# Patient Record
Sex: Female | Born: 1970 | Race: Black or African American | Hispanic: No | Marital: Single | Smoking: Never smoker
Health system: Southern US, Community
[De-identification: ages and names within clinical notes are randomized; demographics above are authoritative.]

## PROBLEM LIST (undated history)

## (undated) DIAGNOSIS — Z87898 Personal history of other specified conditions: Secondary | ICD-10-CM

## (undated) DIAGNOSIS — N941 Unspecified dyspareunia: Secondary | ICD-10-CM

## (undated) DIAGNOSIS — D649 Anemia, unspecified: Secondary | ICD-10-CM

## (undated) DIAGNOSIS — E559 Vitamin D deficiency, unspecified: Secondary | ICD-10-CM

## (undated) DIAGNOSIS — E041 Nontoxic single thyroid nodule: Secondary | ICD-10-CM

## (undated) DIAGNOSIS — N83201 Unspecified ovarian cyst, right side: Secondary | ICD-10-CM

## (undated) DIAGNOSIS — Z8742 Personal history of other diseases of the female genital tract: Secondary | ICD-10-CM

## (undated) DIAGNOSIS — N736 Female pelvic peritoneal adhesions (postinfective): Secondary | ICD-10-CM

---

## 2004-11-19 ENCOUNTER — Emergency Department (HOSPITAL_COMMUNITY): Admission: EM | Admit: 2004-11-19 | Discharge: 2004-11-20 | Payer: Self-pay | Admitting: Emergency Medicine

## 2006-08-02 ENCOUNTER — Emergency Department (HOSPITAL_COMMUNITY): Admission: EM | Admit: 2006-08-02 | Discharge: 2006-08-03 | Payer: Self-pay | Admitting: Emergency Medicine

## 2007-02-04 ENCOUNTER — Emergency Department (HOSPITAL_COMMUNITY): Admission: EM | Admit: 2007-02-04 | Discharge: 2007-02-04 | Payer: Self-pay | Admitting: Emergency Medicine

## 2008-01-18 DIAGNOSIS — Z87898 Personal history of other specified conditions: Secondary | ICD-10-CM

## 2008-01-18 HISTORY — DX: Personal history of other specified conditions: Z87.898

## 2008-01-20 ENCOUNTER — Ambulatory Visit (HOSPITAL_COMMUNITY): Admission: RE | Admit: 2008-01-20 | Discharge: 2008-01-20 | Payer: Self-pay | Admitting: Family Medicine

## 2008-01-22 HISTORY — PX: DILATION AND CURETTAGE OF UTERUS: SHX78

## 2008-03-31 ENCOUNTER — Emergency Department (HOSPITAL_COMMUNITY): Admission: EM | Admit: 2008-03-31 | Discharge: 2008-03-31 | Payer: Self-pay | Admitting: Emergency Medicine

## 2008-04-01 ENCOUNTER — Ambulatory Visit (HOSPITAL_COMMUNITY): Admission: RE | Admit: 2008-04-01 | Discharge: 2008-04-01 | Payer: Self-pay | Admitting: Obstetrics and Gynecology

## 2008-04-01 ENCOUNTER — Encounter (INDEPENDENT_AMBULATORY_CARE_PROVIDER_SITE_OTHER): Payer: Self-pay | Admitting: Obstetrics and Gynecology

## 2009-01-09 IMAGING — US US PELVIS COMPLETE MODIFY
1 series · 13 of 25 positions shown · non-contrast
Comparison: None

CLINICAL DATA: 37-year-old with dysfunctional uterine bleeding.
History of cesarean section.

TRANSABDOMINAL AND TRANSVAGINAL ULTRASOUND OF PELVIS
TECHNIQUE: Both transabdominal and transvaginal ultrasound
examinations of the pelvis were performed including evaluation of
the uterus, ovaries, adnexal regions, and pelvic cul-de-sac.

[Series 1: us transvaginal non-ob · 13 of 102 slices shown]
[im 1/102]
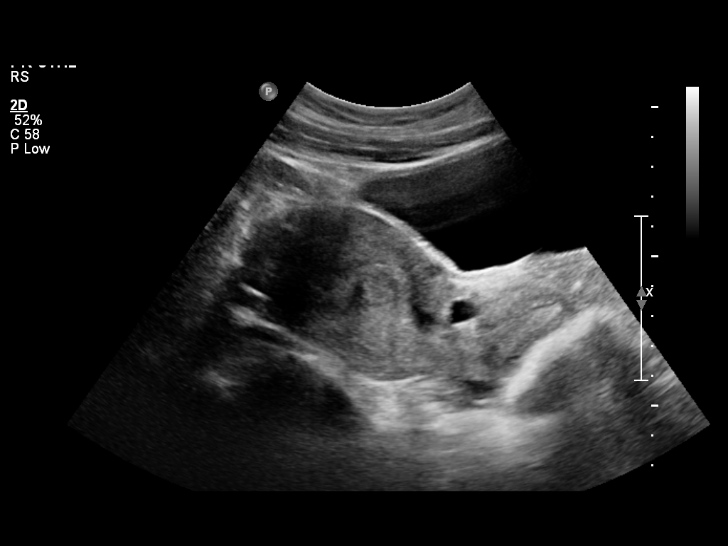
[im 9/102]
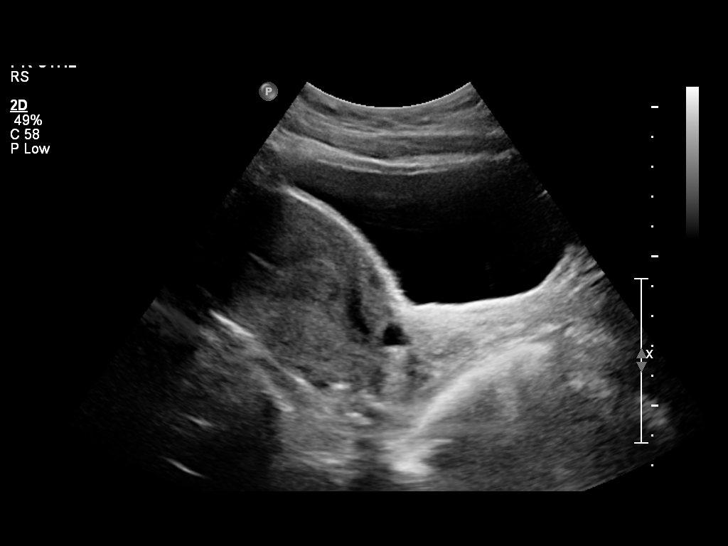
[im 17/102]
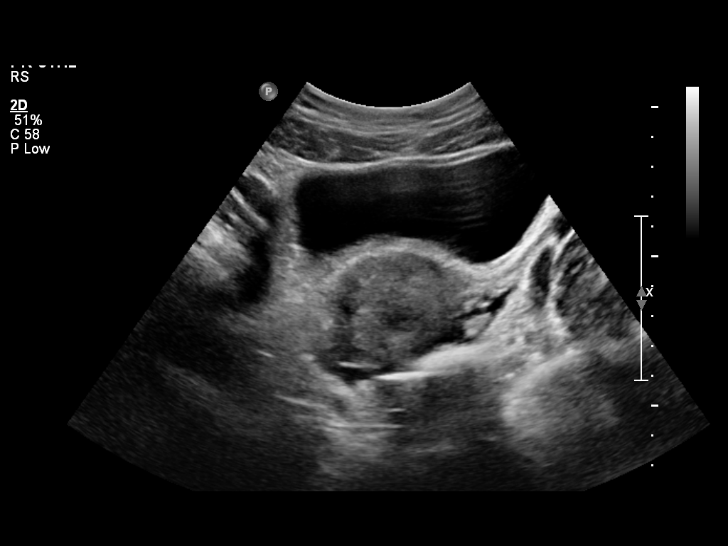
[im 26/102]
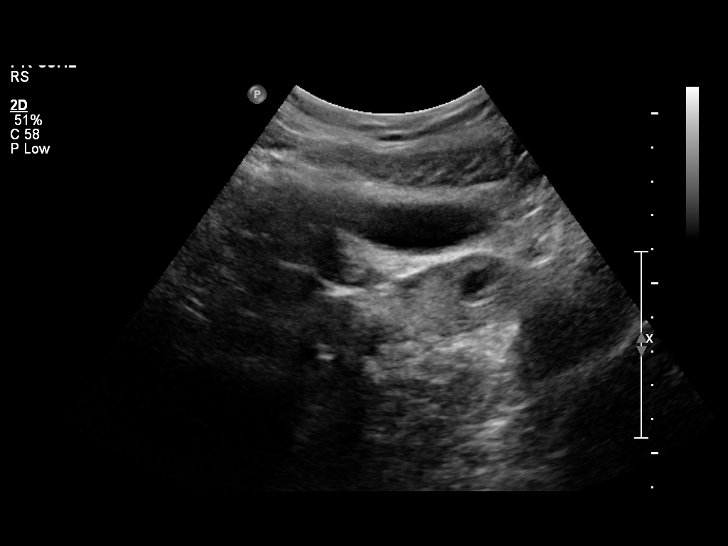
[im 34/102]
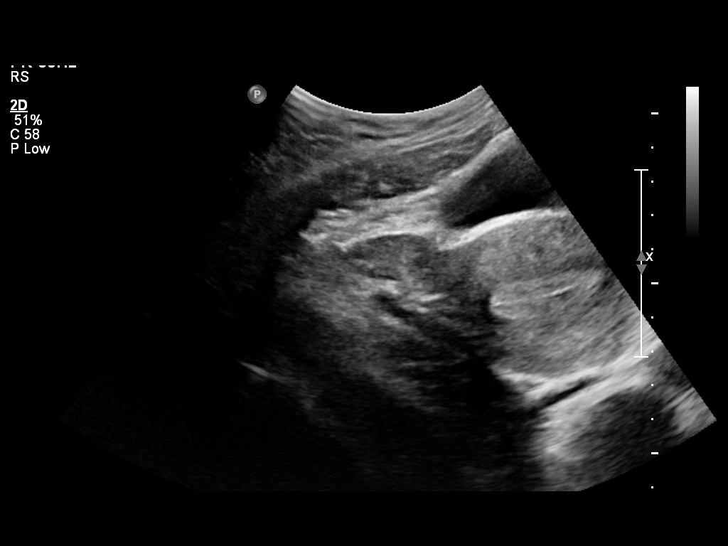
[im 43/102]
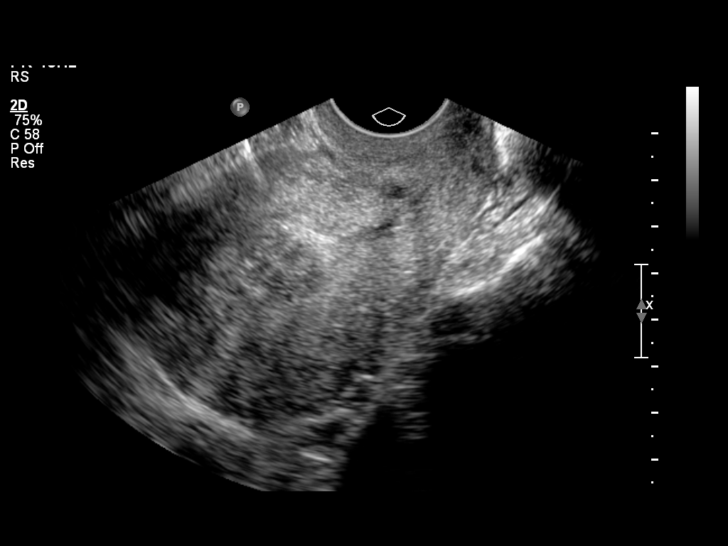
[im 51/102]
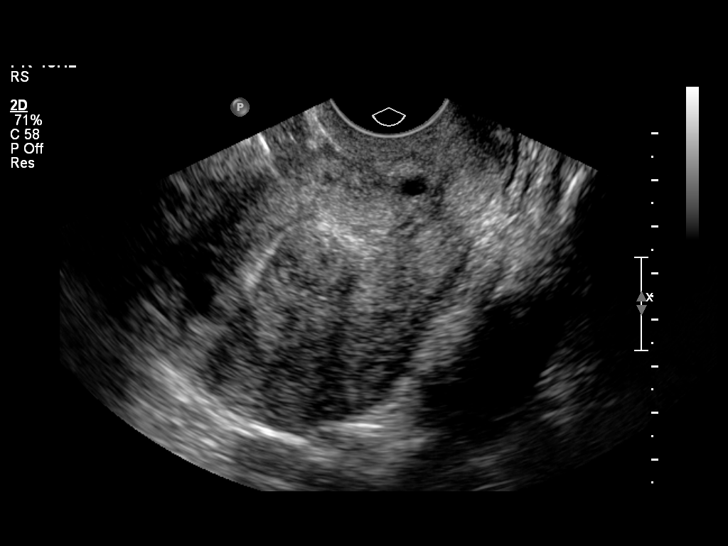
[im 59/102]
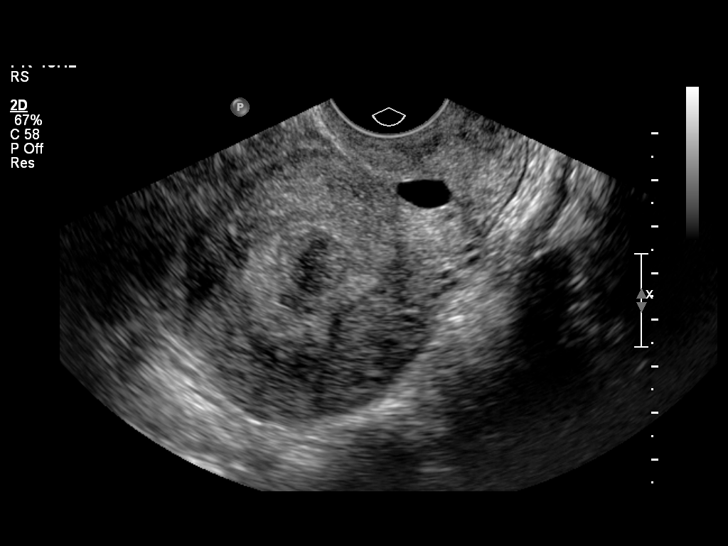
[im 68/102]
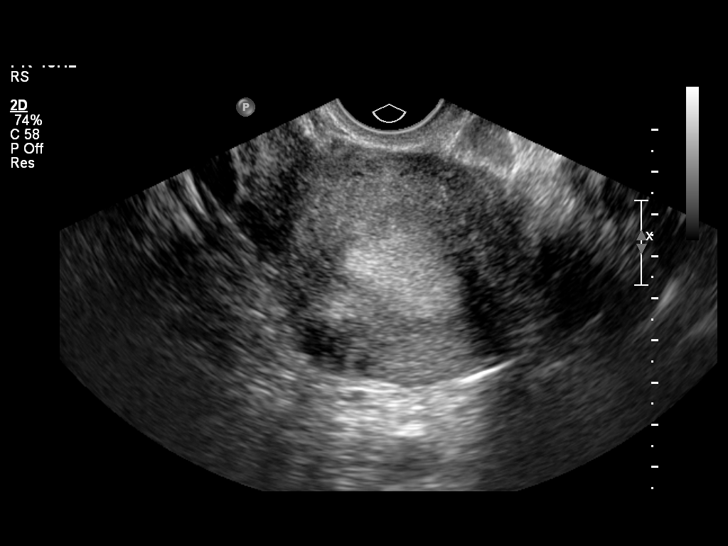
[im 76/102]
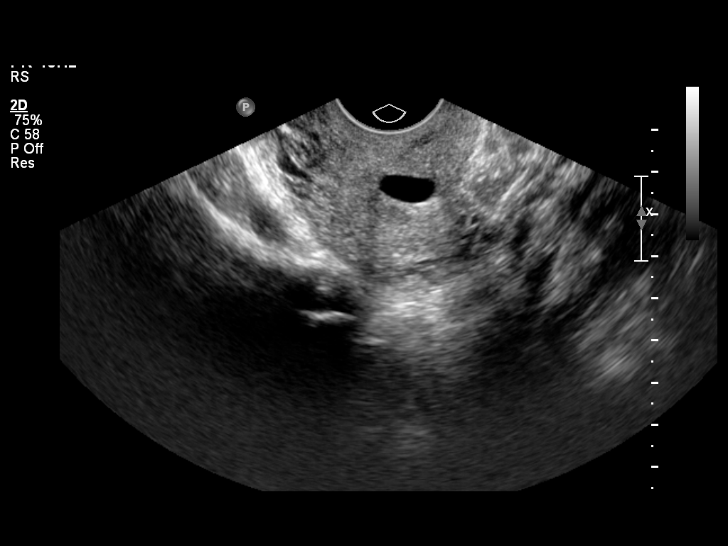
[im 85/102]
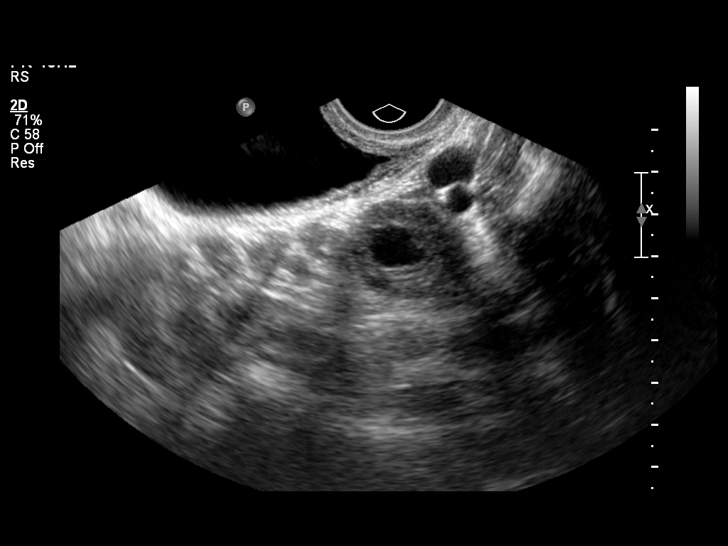
[im 93/102]
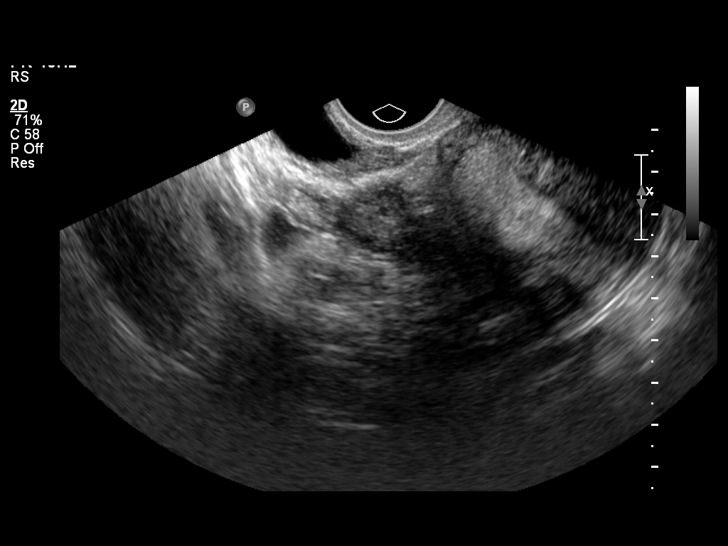
[im 102/102]
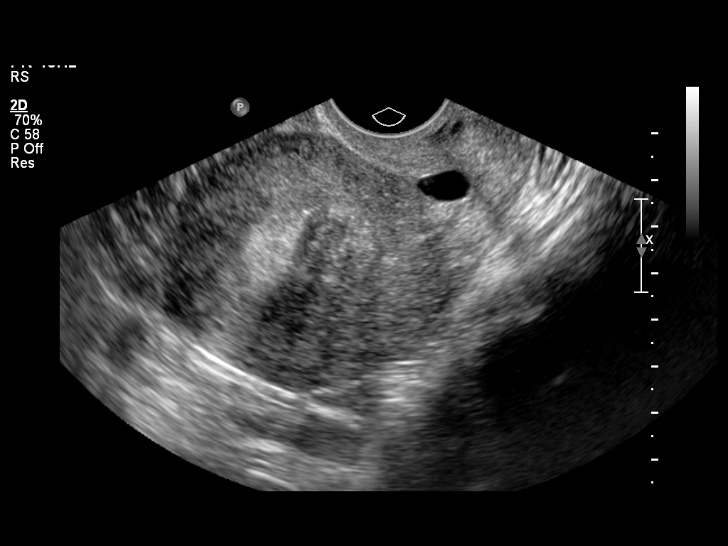

[13 of 25 positions shown; findings below may reference images not displayed]

FINDINGS: The uterus measures 8.5 x 5.4 x 6.3 cm.  At the uterine
fundus, there is a small subserosal fibroid which measures 1.9 x
1.3 x 1.9 cm.  The myometrium is otherwise homogeneous.

The endometrium is mildly thickened 1.5 cm.  Projecting into the
endometrial canal is a hypoechoic mass which measures 1.8 x 1.5 x
1.7 cm.  This demonstrates internal blood flow and may reflect an
endometrial polyp or a pedunculated submucosal fibroid.

Both ovaries are visualized and appear normal.  The right ovary
measures 4.2 x 1.9 x 2.1 cm and the left ovary measures 4.1 x 2.4 x
2.7 cm.  It contains a small involuting follicle.  There is no
significant free pelvic fluid.
IMPRESSION: 1.  1.8 cm hypoechoic mass protruding into the endometrial canal
may reflect a pedunculated fibroid or an endometrial polyp.  This
may contribute to dysfunctional uterine bleeding and may be further
evaluated with pelvic MRI or hysterosonogram.
2.  Small subserosal fundal fibroid.
3.  Both ovaries appear unremarkable.

## 2010-05-03 LAB — CBC
HCT: 35.5 % — ABNORMAL LOW (ref 36.0–46.0)
Platelets: 246 10*3/uL (ref 150–400)
RBC: 4.16 MIL/uL (ref 3.87–5.11)
WBC: 4.8 10*3/uL (ref 4.0–10.5)

## 2010-05-03 LAB — PREGNANCY, URINE

## 2010-06-05 NOTE — Op Note (Signed)
NAMEALAZE, GARVERICK                 ACCOUNT NO.:  [id number]   MEDICAL RECORD NO.:  [id number]         PATIENT TYPE:  WAMB   LOCATION:                                FACILITY:  [facility]   PHYSICIAN:  Michelle L. Grewal, M.D.DATE OF BIRTH:  1970-09-08   DATE OF PROCEDURE:  04/01/2008  DATE OF DISCHARGE:                               OPERATIVE REPORT   PREOPERATIVE DIAGNOSES:  Menorrhagia and endometrial polyp.   POSTOPERATIVE DIAGNOSES:  Menorrhagia and submucosal/pedunculated  fibroid.   PROCEDURE:  Dilation and curettage, operative hysteroscopy and resection  of submucosal fibroids.   SURGEON:  Michelle L. Grewal, MD   ANESTHESIA:  MAC with local.   FINDINGS:  A 2 cm fibroid in the endometrial cavity, questionable  pedunculated 100% was removed.   SPECIMENS:  Uterine curettings and fibroid sent to pathology.   ESTIMATED BLOOD LOSS:  Minimal.   COMPLICATIONS:  None.   PROCEDURE:  The patient was taken to the operating room.  She was given  her anesthesia.  She was prepped and draped in usual sterile fashion.  A  speculum was inserted into the vagina.  The bladder was emptied with an  in-and-out catheter.  The cervical internal os was gently dilated after  a paracervical block was performed in standard fashion.  The diagnostic  hysteroscope was then inserted to the uterine cavity and with excellent  visualization, I noted a 2 cm lesion within the cavity appeared to be  pedunculated and to me it looked more like a fibroid than a polyp.  The  ultrasound preop done at [facility] had read it as endometrial  polyp.  I removed the diagnostic hysteroscope then inserted a sharp  curette and thoroughly curetted the uterine cavity.  I inserted a polyp  forceps into the uterine cavity and then I could feel what felt like the  fibroid, thus unable to remove it.  I then converted to an operative  hysteroscope, inserted the hysteroscope, and then using the loop then  used a small amount  of cautery and I was able to slice the fibroid into  multiple pieces.  This then enabled me to remove it completely much more  easily with a polyp forceps.  I did this and then I looked again with  the hysteroscope and the uterine cavity was completely clean, and there  was no bleeding.  All instruments were removed from the vagina.  All  sponge, lap, and instrument counts were correct x2.  The patient went to  recovery room in stable condition.      Michelle L. Vincente Poli, M.D.  Electronically Signed    MLG/MEDQ  D:  04/01/2008  T:  04/01/2008  Job:  [id number]

## 2010-08-27 ENCOUNTER — Other Ambulatory Visit: Payer: Self-pay | Admitting: Family Medicine

## 2010-08-27 DIAGNOSIS — Z1231 Encounter for screening mammogram for malignant neoplasm of breast: Secondary | ICD-10-CM

## 2010-09-03 ENCOUNTER — Ambulatory Visit: Payer: Self-pay

## 2010-09-06 ENCOUNTER — Ambulatory Visit: Payer: Self-pay

## 2010-11-06 LAB — URINALYSIS, ROUTINE W REFLEX MICROSCOPIC
Bilirubin Urine: NEGATIVE
Nitrite: NEGATIVE
Specific Gravity, Urine: 1.026

## 2010-11-06 LAB — URINE MICROSCOPIC-ADD ON

## 2010-11-29 DIAGNOSIS — E559 Vitamin D deficiency, unspecified: Secondary | ICD-10-CM

## 2010-11-29 HISTORY — DX: Vitamin D deficiency, unspecified: E55.9

## 2012-06-07 ENCOUNTER — Emergency Department (HOSPITAL_COMMUNITY): Payer: Managed Care, Other (non HMO)

## 2012-06-07 ENCOUNTER — Encounter (HOSPITAL_COMMUNITY): Payer: Self-pay | Admitting: Adult Health

## 2012-06-07 ENCOUNTER — Emergency Department (HOSPITAL_COMMUNITY)
Admission: EM | Admit: 2012-06-07 | Discharge: 2012-06-07 | Disposition: A | Discharge status: Home / Self Care | Payer: Managed Care, Other (non HMO) | Attending: Emergency Medicine | Admitting: Emergency Medicine

## 2012-06-07 DIAGNOSIS — Z79899 Other long term (current) drug therapy: Secondary | ICD-10-CM | POA: Insufficient documentation

## 2012-06-07 DIAGNOSIS — R110 Nausea: Secondary | ICD-10-CM | POA: Insufficient documentation

## 2012-06-07 DIAGNOSIS — R079 Chest pain, unspecified: Secondary | ICD-10-CM

## 2012-06-07 LAB — BASIC METABOLIC PANEL
CO2: 29 mEq/L (ref 19–32)
Chloride: 103 mEq/L (ref 96–112)
Glucose, Bld: 92 mg/dL (ref 70–99)
Potassium: 4 mEq/L (ref 3.5–5.1)
Sodium: 139 mEq/L (ref 135–145)

## 2012-06-07 LAB — BASIC METABOLIC PANEL WITH GFR
BUN: 15 mg/dL (ref 6–23)
Calcium: 9.7 mg/dL (ref 8.4–10.5)
Creatinine, Ser: 0.89 mg/dL (ref 0.50–1.10)
GFR calc Af Amer: >90 mL/min (ref >90)
GFR calc non Af Amer: 79 mL/min — ABNORMAL LOW (ref >90)

## 2012-06-07 LAB — CBC
HCT: 34.2 % — ABNORMAL LOW (ref 36.0–46.0)
Hemoglobin: 12 g/dL (ref 12.0–15.0)
MCH: 28 pg (ref 26.0–34.0)
MCHC: 35.1 g/dL (ref 30.0–36.0)
MCV: 79.9 fL (ref 78.0–100.0)
Platelets: 254 10*3/uL (ref 150–400)
RBC: 4.28 MIL/uL (ref 3.87–5.11)
RDW: 13.2 % (ref 11.5–15.5)
WBC: 6.6 10*3/uL (ref 4.0–10.5)

## 2012-06-07 LAB — POCT I-STAT TROPONIN I: Troponin i, poc: 0 ng/mL (ref 0.00–0.08)

## 2012-06-07 MED ORDER — ALPRAZOLAM 0.5 MG PO TABS: 0.5000 mg | ORAL_TABLET | Freq: Three times a day (TID) | ORAL | Status: DC | PRN

## 2012-06-07 MED ORDER — LORAZEPAM 1 MG PO TABS
1.0000 mg | ORAL_TABLET | Freq: Once | ORAL | Status: AC
  Administered 2012-06-07: 1 mg via ORAL
  Filled 2012-06-07: qty 1

## 2012-06-07 MED ORDER — GI COCKTAIL ~~LOC~~
30.0000 mL | Freq: Once | ORAL | Status: AC
  Administered 2012-06-07: 30 mL via ORAL
  Filled 2012-06-07: qty 30

## 2012-06-07 NOTE — Discharge Instructions (Signed)
 Chest Pain (Nonspecific) It is often hard to give a specific diagnosis for the cause of chest pain. There is always a chance that your pain could be related to something serious, such as a heart attack or a blood clot in the lungs. You need to follow up with your caregiver for further evaluation. CAUSES   Heartburn.  Pneumonia or bronchitis.  Anxiety or stress.  Inflammation around your heart (pericarditis) or lung (pleuritis or pleurisy).  A blood clot in the lung.  A collapsed lung (pneumothorax). It can develop suddenly on its own (spontaneous pneumothorax) or from injury (trauma) to the chest.  Shingles infection (herpes zoster virus). The chest wall is composed of bones, muscles, and cartilage. Any of these can be the source of the pain.  The bones can be bruised by injury.  The muscles or cartilage can be strained by coughing or overwork.  The cartilage can be affected by inflammation and become sore (costochondritis). DIAGNOSIS  Lab tests or other studies, such as X-rays, electrocardiography, stress testing, or cardiac imaging, may be needed to find the cause of your pain.  TREATMENT   Treatment depends on what may be causing your chest pain. Treatment may include:  Acid blockers for heartburn.  Anti-inflammatory medicine.  Pain medicine for inflammatory conditions.  Antibiotics if an infection is present.  You may be advised to change lifestyle habits. This includes stopping smoking and avoiding alcohol, caffeine, and chocolate.  You may be advised to keep your head raised (elevated) when sleeping. This reduces the chance of acid going backward from your stomach into your esophagus.  Most of the time, nonspecific chest pain will improve within 2 to 3 days with rest and mild pain medicine. HOME CARE INSTRUCTIONS   If antibiotics were prescribed, take your antibiotics as directed. Finish them even if you start to feel better.  For the next few days, avoid physical  activities that bring on chest pain. Continue physical activities as directed.  Do not smoke.  Avoid drinking alcohol.  Only take over-the-counter or prescription medicine for pain, discomfort, or fever as directed by your caregiver.  Follow your caregiver's suggestions for further testing if your chest pain does not go away.  Keep any follow-up appointments you made. If you do not go to an appointment, you could develop lasting (chronic) problems with pain. If there is any problem keeping an appointment, you must call to reschedule. SEEK MEDICAL CARE IF:   You think you are having problems from the medicine you are taking. Read your medicine instructions carefully.  Your chest pain does not go away, even after treatment.  You develop a rash with blisters on your chest. SEEK IMMEDIATE MEDICAL CARE IF:   You have increased chest pain or pain that spreads to your arm, neck, jaw, back, or abdomen.  You develop shortness of breath, an increasing cough, or you are coughing up blood.  You have severe back or abdominal pain, feel nauseous, or vomit.  You develop severe weakness, fainting, or chills.  You have a fever. THIS IS AN EMERGENCY. Do not wait to see if the pain will go away. Get medical help at once. Call your local emergency services (911 in [place] Do not drive yourself to the hospital. MAKE SURE YOU:   Understand these instructions.  Will watch your condition.  Will get help right away if you are not doing well or get worse. Document Released: 10/17/2004 Document Revised: 04/01/2011 Document Reviewed: 08/13/2007 [facility] Patient Information 2013 [employer].    Narcotic and benzodiazepine use may cause drowsiness, slowed breathing or dependence.  Please use with caution and do not drive, operate machinery or watch young children alone while taking them.  Taking combinations of these medications or drinking alcohol will potentiate these effects.

## 2012-06-07 NOTE — ED Provider Notes (Signed)
History     CSN: [id number]  Arrival date & time 06/07/12  2054   First MD Initiated Contact with Patient 06/07/12 2121      Chief Complaint  Patient presents with  . Chest Pain    (Consider location/radiation/quality/duration/timing/severity/associated sxs/prior treatment) HPI Comments: Patient presents to the emergency department for evaluation of mid central chest squeezing sensation that is been waxing and waning since Wednesday. She reports no associated dizziness, lightheadedness, shortness of breath, diaphoresis. She denied any nausea or vomiting to me. Apparently she did tell triage nurse that she had some episodes of nausea but apparently her story has changed. She admits that she got on line and looked through web M.D. and came across the term angina and was concerned. She reports no significant past medical history, is not taking any current medications, does not smoke. When searching deeper, she reports that she has been under a lot more stress generally at work. In addition 5 days ago, she did have an argument with her first and did have a brief episode of burning and tightness in her chest during the episode. She reports the following day she rested almost all day. And then she woke up the following morning after that with his first episode of severe chest squeezing sensation. Since then, patient has been able to eat and drink without any difficulty. She does think that physical activity and exertion may make the pain somewhat worse and improved with rest. She denies any family history of coronary disease, heart attacks, strokes within her parents or siblings. She does have a primary care physician, sees a nurse practitioner with the [facility] system.  No antacids or other medications were used for symptoms.    Patient is a 42 y.o. female presenting with chest pain. The history is provided by the patient.  Chest Pain Pain radiates to the back: no   Onset quality:  Gradual Duration:  4  days Progression:  Waxing and waning Chronicity:  New Context: stress   Context: not breathing, not eating, no movement, not raising an arm, not at rest and no trauma   Relieved by:  Rest Associated symptoms: nausea   Associated symptoms: no abdominal pain, no cough, no diaphoresis, no dizziness, no fever, no headache, no shortness of breath and not vomiting     History reviewed. No pertinent past medical history.  History reviewed. No pertinent past surgical history.  History reviewed. No pertinent family history.  History  Substance Use Topics  . Smoking status: Not on file  . Smokeless tobacco: Not on file  . Alcohol Use: Not on file    OB History   Grav Para Term Preterm Abortions TAB SAB Ect Mult Living                  Review of Systems  Constitutional: Negative for fever, chills and diaphoresis.  HENT: Negative for congestion and neck pain.   Respiratory: Negative for cough and shortness of breath.   Cardiovascular: Positive for chest pain.  Gastrointestinal: Positive for nausea. Negative for vomiting and abdominal pain.  Skin: Negative for rash.  Neurological: Negative for dizziness, light-headedness and headaches.  Psychiatric/Behavioral:       + increase in stressors recently  All other systems reviewed and are negative.    Allergies  Review of patient's allergies indicates no known allergies.  Home Medications   Current Outpatient Rx  Name  Route  Sig  Dispense  Refill  . acetaminophen-codeine (TYLENOL #3) 300-30 MG  per tablet   Oral   Take 1 tablet by mouth every 4 (four) hours as needed for pain.         Marland Kitchen ALPRAZolam (XANAX) 0.5 MG tablet   Oral   Take 1 tablet (0.5 mg total) by mouth 3 (three) times daily as needed for anxiety.   15 tablet   0     BP 113/51  Pulse 74  Temp(Src) 98.2 F (36.8 C) (Oral)  Resp 22  SpO2 100%  LMP 05/10/2012  Physical Exam  Nursing note and vitals reviewed. Constitutional: She is oriented to person,  place, and time. She appears well-developed and well-nourished. No distress.  HENT:  Head: Normocephalic and atraumatic.  Eyes: EOM are normal. No scleral icterus.  Neck: Normal range of motion. Neck supple.  Cardiovascular: Normal rate, regular rhythm and intact distal pulses.   No murmur heard. Pulmonary/Chest: Effort normal and breath sounds normal. No respiratory distress. She has no wheezes.  Abdominal: Soft. She exhibits no distension. There is no tenderness. There is no guarding.  Musculoskeletal: She exhibits no edema.  Neurological: She is alert and oriented to person, place, and time. She exhibits normal muscle tone. Coordination normal.  Skin: Skin is warm. She is not diaphoretic.  Psychiatric: She has a normal mood and affect.    ED Course  Procedures (including critical care time)  Labs Reviewed  CBC - Abnormal; Notable for the following:    HCT 34.2 (*)    All other components within normal limits  BASIC METABOLIC PANEL - Abnormal; Notable for the following:    GFR calc non Af Amer 79 (*)    All other components within normal limits  POCT I-STAT TROPONIN I   Dg Chest 2 View  06/07/2012   *RADIOLOGY REPORT*  Clinical Data: Chest pain.  CHEST - 2 VIEW  Comparison: None.  Findings: The cardiac silhouette, mediastinal and hilar contours are normal.  The lungs are clear.  No pleural effusion.  The bony thorax is intact.  IMPRESSION: Normal chest x-ray.   Original Report Authenticated By: Rudie Meyer, M.D.     1. Chest pain     Room air saturation is 100% I interpret to be normal. Vital signs are checked and are all within normal limits. I reviewed nursing notes, triage notes.  ECG at time 21:09 shows NSR at rate 76, normal axis, RSR` leads V1, V2, likely normal variant.  Flat T wave in lead III only.  Borderline ECG.  No priors for comparison.    MDM   Patient with no risk factors for coronary disease, somewhat atypical in that pain began in setting of increased  stressors. Despite the patient's history that pain seems to worsen with physical activity, she has had no associated lightheadedness, dizziness, shortness of breath, nausea or diaphoresis. Her EKG shows no ischemic changes. Her troponin here is normal. Patient's symptoms did not improve a GI cocktail but after by mouth Ativan, she reports about 30 minutes afterwards her chest pain was significantly improved. My clinical suspicion is that stress was involved with her current symptoms. I recommended the patient follow up closely with her primary care physician and can receive a cardiology referral through them of our system and I'm also happy to give her referral to her own the [facility] cardiology group as well. Patient is in agreement with plan as outlined. L. give her a brief prescription for some Xanax to be taken as needed. She is told the side effects of Xanax  may include drowsiness. Patient understands to return for any worsening symptoms        Gavin Pound. Norvell Caswell, MD 06/08/12 0005

## 2012-06-07 NOTE — ED Notes (Signed)
EDP at bedside  

## 2012-06-07 NOTE — ED Notes (Signed)
Presents with sternal chest pressure and "squeezing" that is better with rest ongoing since last weds, pain awakens pt from sleep. Pain is worse with activity.  Associated with nausea at times.

## 2012-06-07 NOTE — ED Notes (Signed)
Patient presents with c/o epigastric pain that has been going on for a few days.  States she has been having a lot of stress at work.  Denies any SOB, N/V

## 2012-08-05 ENCOUNTER — Ambulatory Visit: Payer: Managed Care, Other (non HMO) | Admitting: Cardiovascular Disease

## 2012-09-01 ENCOUNTER — Encounter: Payer: Self-pay | Admitting: Cardiovascular Disease

## 2012-11-26 DIAGNOSIS — D649 Anemia, unspecified: Secondary | ICD-10-CM

## 2012-11-26 HISTORY — DX: Anemia, unspecified: D64.9

## 2013-07-05 ENCOUNTER — Other Ambulatory Visit: Payer: Self-pay | Admitting: Family Medicine

## 2013-07-05 DIAGNOSIS — N6009 Solitary cyst of unspecified breast: Secondary | ICD-10-CM

## 2013-07-28 ENCOUNTER — Ambulatory Visit: Payer: Managed Care, Other (non HMO)

## 2014-08-09 ENCOUNTER — Other Ambulatory Visit: Payer: Self-pay | Admitting: Family Medicine

## 2014-08-09 DIAGNOSIS — N6009 Solitary cyst of unspecified breast: Secondary | ICD-10-CM

## 2014-08-15 ENCOUNTER — Other Ambulatory Visit: Payer: Self-pay | Admitting: Nurse Practitioner

## 2014-08-15 ENCOUNTER — Other Ambulatory Visit: Payer: Managed Care, Other (non HMO)

## 2014-08-15 DIAGNOSIS — N6009 Solitary cyst of unspecified breast: Secondary | ICD-10-CM

## 2014-08-22 ENCOUNTER — Other Ambulatory Visit: Payer: Managed Care, Other (non HMO)

## 2014-10-24 ENCOUNTER — Other Ambulatory Visit: Payer: Self-pay

## 2014-10-24 DIAGNOSIS — Z1231 Encounter for screening mammogram for malignant neoplasm of breast: Secondary | ICD-10-CM

## 2014-11-11 ENCOUNTER — Ambulatory Visit: Payer: Managed Care, Other (non HMO)

## 2014-12-07 ENCOUNTER — Ambulatory Visit: Payer: Managed Care, Other (non HMO)

## 2015-05-18 LAB — GLUCOSE, POCT (MANUAL RESULT ENTRY): POC Glucose: 132 mg/dl — AB (ref 70–99)

## 2015-11-08 ENCOUNTER — Other Ambulatory Visit: Payer: Self-pay

## 2015-11-08 DIAGNOSIS — N644 Mastodynia: Secondary | ICD-10-CM

## 2015-11-09 ENCOUNTER — Other Ambulatory Visit: Payer: Self-pay | Admitting: Nurse Practitioner

## 2015-11-09 DIAGNOSIS — N644 Mastodynia: Secondary | ICD-10-CM

## 2015-11-14 ENCOUNTER — Ambulatory Visit
Admission: RE | Admit: 2015-11-14 | Discharge: 2015-11-14 | Disposition: A | Discharge status: Home / Self Care | Payer: BC Managed Care – PPO | Source: Ambulatory Visit | Attending: Nurse Practitioner | Admitting: Nurse Practitioner

## 2015-11-14 DIAGNOSIS — N644 Mastodynia: Secondary | ICD-10-CM

## 2016-09-21 DIAGNOSIS — E041 Nontoxic single thyroid nodule: Secondary | ICD-10-CM

## 2016-09-21 HISTORY — DX: Nontoxic single thyroid nodule: E04.1

## 2016-10-08 ENCOUNTER — Other Ambulatory Visit: Payer: Self-pay | Admitting: Obstetrics and Gynecology

## 2016-10-08 DIAGNOSIS — E041 Nontoxic single thyroid nodule: Secondary | ICD-10-CM

## 2016-10-17 ENCOUNTER — Ambulatory Visit
Admission: RE | Admit: 2016-10-17 | Discharge: 2016-10-17 | Disposition: A | Discharge status: Home / Self Care | Payer: BC Managed Care – PPO | Source: Ambulatory Visit | Attending: Obstetrics and Gynecology | Admitting: Obstetrics and Gynecology

## 2016-10-17 DIAGNOSIS — E041 Nontoxic single thyroid nodule: Secondary | ICD-10-CM

## 2016-10-23 ENCOUNTER — Other Ambulatory Visit: Payer: Self-pay | Admitting: Obstetrics and Gynecology

## 2016-10-23 DIAGNOSIS — Z1231 Encounter for screening mammogram for malignant neoplasm of breast: Secondary | ICD-10-CM

## 2016-10-24 ENCOUNTER — Other Ambulatory Visit: Payer: Self-pay | Admitting: Obstetrics and Gynecology

## 2016-10-24 DIAGNOSIS — E041 Nontoxic single thyroid nodule: Secondary | ICD-10-CM

## 2016-10-30 ENCOUNTER — Ambulatory Visit
Admission: RE | Admit: 2016-10-30 | Discharge: 2016-10-30 | Disposition: A | Discharge status: Home / Self Care | Payer: BC Managed Care – PPO | Source: Ambulatory Visit | Attending: Obstetrics and Gynecology | Admitting: Obstetrics and Gynecology

## 2016-10-30 ENCOUNTER — Other Ambulatory Visit (HOSPITAL_COMMUNITY)
Admission: RE | Admit: 2016-10-30 | Discharge: 2016-10-30 | Disposition: A | Discharge status: Home / Self Care | Payer: BC Managed Care – PPO | Source: Ambulatory Visit | Attending: Radiology | Admitting: Radiology

## 2016-10-30 DIAGNOSIS — E041 Nontoxic single thyroid nodule: Secondary | ICD-10-CM | POA: Insufficient documentation

## 2016-11-15 ENCOUNTER — Ambulatory Visit
Admission: RE | Admit: 2016-11-15 | Discharge: 2016-11-15 | Disposition: A | Discharge status: Home / Self Care | Payer: BC Managed Care – PPO | Source: Ambulatory Visit | Attending: Obstetrics and Gynecology | Admitting: Obstetrics and Gynecology

## 2016-11-15 DIAGNOSIS — Z1231 Encounter for screening mammogram for malignant neoplasm of breast: Secondary | ICD-10-CM

## 2016-11-15 NOTE — H&P (Signed)
NAME:  Shelia Hamilton, KARAN NO.:  [id number]  MEDICAL RECORD NO.:  [id number]  LOCATION:                                 FACILITY:  PHYSICIAN:  Darlyn Chamber, M.D.        DATE OF BIRTH:  DATE OF ADMISSION: DATE OF DISCHARGE:                             HISTORY & PHYSICAL   DATE OF SURGERY:  December 02, 2016, it is going to be done at [facility] outpatient area on [address].  HISTORY AND PHYSICAL:  The patient is a 46 year old, gravida 1, para 50 female, who presents now for laparoscopic-assisted vaginal hysterectomy. The patient has been having trouble with increasing menstrual flow.  Her periods last 7 days.  She will change pads 3 times per day with clots and dysmenorrhea and she does have significant pain with her cycle.  She underwent saline infusion ultrasound.  She had multiple fibroids, 1 impinging on the uterine cavity that could be the cause of the bleeding. We discussed various options for management including hormonal suppression versus hydrothermal ablation versus radiological embolization.  The patient now presents for laparoscopic-assisted vaginal hysterectomy.  Per the patient's request, ovaries will be left in place.  ALLERGIES:  In terms of allergies, the patient has no known drug allergies.  MEDICATIONS:  None.  PAST MEDICAL HISTORY:  She has had a cesarean section.  Otherwise, usual childhood diseases without any significant sequelae.  FAMILY HISTORY:  Mother had a history of breast cancer, otherwise unremarkable.  SOCIAL HISTORY:  No tobacco or alcohol use.  REVIEW OF SYSTEMS:  Noncontributory.  PHYSICAL EXAMINATION:  VITAL SIGNS:  The patient is afebrile.  Stable vital signs. HEENT:  The patient is normocephalic.  Pupils equal, round, and reactive to light and accommodation.  Extraocular movements were intact.  Sclerae and conjunctivae clear.  Oropharynx clear. NECK:  Without thyromegaly. BREASTS:  No discrete  masses. LUNGS:  Clear. CARDIOVASCULAR:  Regular rate.  No murmurs or gallops.  No carotid or abdominal bruits. ABDOMEN:  Benign.  No masses, organomegaly, or tenderness.  Does have a well-healed, low-transverse incision. PELVIC:  Normal external genitalia.  Vaginal mucosa is clear.  Cervix unremarkable.  Uterus upper limits of normal size.  Adnexa unremarkable.  IMPRESSION:  Menorrhagia and dysmenorrhea secondary to adenomyosis and possible uterine fibroids.  PLAN:  Again, options have been discussed and we will proceed with laparoscopic-assisted vaginal hysterectomy.  Risks of surgery have been discussed including the risk of infection.  Risk of hemorrhage that could require transfusion with the risk of AIDS or hepatitis.  Risk of injury to adjacent organs including bladder, bowel, ureters that could require further exploratory surgery.  Risk of deep venous thrombosis and pulmonary embolus.  The patient expressed understanding of the indications, risks, and alternatives.     Darlyn Chamber, M.D.   ______________________________ Darlyn Chamber, M.D.    JSM/MEDQ  D:  11/15/2016  T:  11/15/2016  Job:  [id number]

## 2016-11-15 NOTE — H&P (Signed)
Patient name  Shelia Hamilton, Shelia Hamilton DICTATION# [id number]   [facility], MD 11/15/2016 9:04 AM

## 2016-11-21 ENCOUNTER — Other Ambulatory Visit: Payer: Self-pay | Admitting: Obstetrics and Gynecology

## 2016-11-21 DIAGNOSIS — Z803 Family history of malignant neoplasm of breast: Secondary | ICD-10-CM

## 2016-11-21 HISTORY — PX: BREAST BIOPSY: SHX20

## 2016-11-22 ENCOUNTER — Ambulatory Visit
Admission: RE | Admit: 2016-11-22 | Discharge: 2016-11-22 | Disposition: A | Discharge status: Home / Self Care | Payer: BC Managed Care – PPO | Source: Ambulatory Visit | Attending: Obstetrics and Gynecology | Admitting: Obstetrics and Gynecology

## 2016-11-22 DIAGNOSIS — Z803 Family history of malignant neoplasm of breast: Secondary | ICD-10-CM

## 2016-11-22 MED ORDER — GADOBENATE DIMEGLUMINE 529 MG/ML IV SOLN
18.0000 mL | Freq: Once | INTRAVENOUS | Status: AC | PRN
  Administered 2016-11-22: 18 mL via INTRAVENOUS

## 2016-11-25 NOTE — Patient Instructions (Addendum)
    Shelia Hamilton  11/25/2016      Your procedure is scheduled on 12-02-16  Report to [facility]  At 5:30  A.M.  Call this number if you have problems the morning of surgery:[phone]             OUR ADDRESS IS [address] , WE ARE LOCATED IN [place].    Remember:  Do not eat food or drink liquids after midnight.  Take these medicines the morning of surgery with A SIP OF WATER: Alprazolam (Xanax) as needed.  Do not wear jewelry, make-up or nail polish.  Do not wear lotions, powders, or perfumes, or deoderant.  Do not shave 48 hours prior to surgery.  Men may shave face and neck.  Do not bring valuables to the hospital.  [facility] is not responsible for any belongings or valuables.  Contacts, dentures or bridgework may not be worn into surgery.  Leave your suitcase in the car.  After surgery it may be brought to your room.  For patients admitted to the hospital, discharge time will be determined by your treatment team.   Special instructions:   Please read over the following fact sheets that you were given.

## 2016-11-26 ENCOUNTER — Other Ambulatory Visit: Payer: Self-pay | Admitting: Obstetrics and Gynecology

## 2016-11-26 ENCOUNTER — Encounter (HOSPITAL_COMMUNITY)
Admission: RE | Admit: 2016-11-26 | Discharge: 2016-11-26 | Disposition: A | Discharge status: Home / Self Care | Payer: BC Managed Care – PPO | Source: Ambulatory Visit | Attending: Obstetrics and Gynecology | Admitting: Obstetrics and Gynecology

## 2016-11-27 ENCOUNTER — Other Ambulatory Visit: Payer: Self-pay | Admitting: Obstetrics and Gynecology

## 2016-11-27 DIAGNOSIS — R9389 Abnormal findings on diagnostic imaging of other specified body structures: Secondary | ICD-10-CM

## 2016-11-28 ENCOUNTER — Other Ambulatory Visit (HOSPITAL_COMMUNITY): Payer: BC Managed Care – PPO

## 2016-11-29 ENCOUNTER — Other Ambulatory Visit: Payer: BC Managed Care – PPO

## 2016-12-03 ENCOUNTER — Other Ambulatory Visit: Payer: Self-pay | Admitting: Obstetrics and Gynecology

## 2016-12-03 ENCOUNTER — Ambulatory Visit
Admission: RE | Admit: 2016-12-03 | Discharge: 2016-12-03 | Disposition: A | Discharge status: Home / Self Care | Payer: BC Managed Care – PPO | Source: Ambulatory Visit | Attending: Obstetrics and Gynecology | Admitting: Obstetrics and Gynecology

## 2016-12-03 DIAGNOSIS — N6310 Unspecified lump in the right breast, unspecified quadrant: Secondary | ICD-10-CM

## 2016-12-03 DIAGNOSIS — R9389 Abnormal findings on diagnostic imaging of other specified body structures: Secondary | ICD-10-CM

## 2016-12-05 ENCOUNTER — Ambulatory Visit
Admission: RE | Admit: 2016-12-05 | Discharge: 2016-12-05 | Disposition: A | Discharge status: Home / Self Care | Payer: BC Managed Care – PPO | Source: Ambulatory Visit | Attending: Obstetrics and Gynecology | Admitting: Obstetrics and Gynecology

## 2016-12-05 DIAGNOSIS — N6310 Unspecified lump in the right breast, unspecified quadrant: Secondary | ICD-10-CM

## 2016-12-05 DIAGNOSIS — R9389 Abnormal findings on diagnostic imaging of other specified body structures: Secondary | ICD-10-CM

## 2016-12-09 ENCOUNTER — Ambulatory Visit: Payer: Self-pay | Admitting: Surgery

## 2017-01-07 ENCOUNTER — Encounter: Payer: Self-pay | Admitting: Surgery

## 2017-01-07 DIAGNOSIS — E041 Nontoxic single thyroid nodule: Secondary | ICD-10-CM | POA: Diagnosis present

## 2017-01-07 NOTE — H&P (Signed)
General Surgery [facility]  Ailene Ards DOB: 1970/06/21 Divorced / Language: Cleophus Molt / Race: Black or African American Female   History of Present Illness   The patient is a 46 year old female who presents with a thyroid nodule.  CC: right thyroid nodule  Patient is referred by Dr. Jacelyn Pi for surgical evaluation and management of right thyroid nodule. Patient's gynecologist is Dr. Arvella Nigh. Patient was found on physical examination by her gynecologist to have a right sided thyroid mass. She subsequently underwent thyroid ultrasound showing a dominant nodule on the right side measuring 3.4 x 1.6 x 2.2 cm. Left thyroid lobe was normal. Patient underwent biopsy on October 30, 2016. This demonstrated a benign follicular nodule, Bethesda category II. Patient complains of compressive symptoms including discomfort in the right neck radiating towards the ear, occasional shortness of breath and air hunger, intermittent hoarseness, and globus sensation. She denies any significant dysphagia. She has never been on thyroid medication. TSH level was normal at 1.33. She has had no prior head or neck surgery. There is no family history of thyroid disease and no family history of other endocrine neoplasms. Patient was evaluated by endocrinology and referred to surgery for consideration for right thyroid lobectomy for dominant thyroid nodule with compressive symptoms. 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   Past Surgical History  Cesarean Section - 1   Diagnostic Studies History  Colonoscopy  never Mammogram  within last year Pap Smear  1-5 years ago  Allergies No Known Drug Allergies 12/09/2016 Allergies Reconciled   Medication History  No Current Medications Medications Reconciled  Social History No alcohol use  No caffeine use  No drug use  Tobacco use  Never smoker.  Family History  Alcohol Abuse   Mother. Breast Cancer  Mother. Cerebrovascular Accident  Family Members In General. Depression  Mother. Heart disease in female family member before age 42  Hypertension  Family Members In General.  Pregnancy / Birth History  Age at menarche  65 years. Gravida  1 Irregular periods  Length (months) of breastfeeding  12-24 Maternal age  76-25 Para  1  Other Problems No pertinent past medical history   Review of Systems General Present- Fatigue, Night Sweats and Weight Gain. Not Present- Appetite Loss, Chills, Fever and Weight Loss. Skin Not Present- Change in Wart/Mole, Dryness, Hives, Jaundice, New Lesions, Non-Healing Wounds, Rash and Ulcer. HEENT Present- Earache, Hoarseness, Ringing in the Ears and Sore Throat. Not Present- Hearing Loss, Nose Bleed, Oral Ulcers, Seasonal Allergies, Sinus Pain, Visual Disturbances, Wears glasses/contact lenses and Yellow Eyes. Respiratory Not Present- Bloody sputum, Chronic Cough, Difficulty Breathing, Snoring and Wheezing. Cardiovascular Not Present- Chest Pain, Difficulty Breathing Lying Down, Leg Cramps, Palpitations, Rapid Heart Rate, Shortness of Breath and Swelling of Extremities. Gastrointestinal Present- Difficulty Swallowing. Not Present- Abdominal Pain, Bloating, Bloody Stool, Change in Bowel Habits, Chronic diarrhea, Constipation, Excessive gas, Gets full quickly at meals, Hemorrhoids, Indigestion, Nausea, Rectal Pain and Vomiting. Female Genitourinary Not Present- Frequency, Nocturia, Painful Urination, Pelvic Pain and Urgency. Musculoskeletal Not Present- Back Pain, Joint Pain, Joint Stiffness, Muscle Pain, Muscle Weakness and Swelling of Extremities. Neurological Present- Numbness and Tingling. Not Present- Decreased Memory, Fainting, Headaches, Seizures, Tremor, Trouble walking and Weakness. Psychiatric Not Present- Anxiety, Bipolar, Change in Sleep Pattern, Depression, Fearful and Frequent crying. Endocrine Present- Cold  Intolerance. Not Present- Excessive Hunger, Hair Changes, Heat Intolerance, Hot flashes and New Diabetes.  Vitals Weight: 195 lb Height:  63in Body Surface Area: 1.91 m Body Mass Index: 34.54 kg/m  Temp.: 98.41F  Pulse: 86 (Regular)  BP: 118/72 (Sitting, Left Arm, Standard)   Physical Exam   See vital signs recorded above  GENERAL APPEARANCE Development: normal Nutritional status: normal Gross deformities: none  SKIN Rash, lesions, ulcers: none Induration, erythema: none Nodules: none palpable  EYES Conjunctiva and lids: normal Pupils: equal and reactive Iris: normal bilaterally  EARS, NOSE, MOUTH, THROAT External ears: no lesion or deformity External nose: no lesion or deformity Hearing: grossly normal Lips: no lesion or deformity Dentition: normal for age Oral mucosa: moist  NECK Symmetric: yes Trachea: midline Thyroid: Dominant mass right thyroid lobe, smooth, firm, mobile with swallowing, mildly tender to palpation; left thyroid lobe without palpable abnormality  CHEST Respiratory effort: normal Retraction or accessory muscle use: no Breath sounds: normal bilaterally Rales, rhonchi, wheeze: none  CARDIOVASCULAR Auscultation: regular rhythm, normal rate Murmurs: none Pulses: carotid and radial pulse 2+ palpable Lower extremity edema: none Lower extremity varicosities: none  MUSCULOSKELETAL Station and gait: normal Digits and nails: no clubbing or cyanosis Muscle strength: grossly normal all extremities Range of motion: grossly normal all extremities Deformity: none  LYMPHATIC Cervical: none palpable Supraclavicular: none palpable  PSYCHIATRIC Oriented to person, place, and time: yes Mood and affect: normal for situation Judgment and insight: appropriate for situation    Assessment & Plan  RIGHT THYROID NODULE (E04.1)  Pt Education - Pamphlet Given - The Thyroid Book: discussed with patient and provided information. Patient  is referred for evaluation of right thyroid nodule with compressive symptoms. Patient is provided with written literature on thyroid surgery to review at home.  Patient has a dominant mass in the right thyroid lobe. Biopsy shows this to be benign. We discussed the fact that this did not absolutely require surgical resection and could be followed with annual ultrasound examination and physical examination.  However she does complain of compressive symptoms and desires surgical removal. Patient and I discussed the procedure of right thyroid lobectomy. We discussed risk and benefits including the potential for recurrent laryngeal nerve injury. We discussed the potential need for additional surgery in the event of malignancy. We discussed the hospital stay to be anticipated and her postoperative recovery and return to work. Patient understands and wishes to proceed with surgery after the first of the year.  The risks and benefits of the procedure have been discussed at length with the patient. The patient understands the proposed procedure, potential alternative treatments, and the course of recovery to be expected. All of the patient's questions have been answered at this time. The patient wishes to proceed with surgery.  Armandina Gemma, [place] [facility] Office: [phone]

## 2017-01-09 NOTE — Patient Instructions (Addendum)
Shelia Hamilton  01/09/2017   Your procedure is scheduled on:   01-17-17  Report to [facility] Main  Entrance Take [place]  elevators to 3rd floor to  [place] at  [place] AM.    Call this number if you have problems the morning of surgery [phone]    Remember: ONLY 1 PERSON MAY GO WITH YOU TO SHORT STAY TO GET  READY MORNING OF YOUR SURGERY.  Do not eat food or drink liquids :After Midnight.     Take these medicines the morning of surgery with A SIP OF WATER: NONE                                You may not have any metal on your body including hair pins and              piercings  Do not wear jewelry, make-up, lotions, powders or perfumes, deodorant             Do not wear nail polish.  Do not shave  48 hours prior to surgery.        Do not bring valuables to the hospital. [place].  Contacts, dentures or bridgework may not be worn into surgery.  Leave suitcase in the car. After surgery it may be brought to your room.               Please read over the following fact sheets you were given: _____________________________________________________________________           [facility] - Preparing for Surgery Before surgery, you can play an important role.  Because skin is not sterile, your skin needs to be as free of germs as possible.  You can reduce the number of germs on your skin by washing with CHG (chlorahexidine gluconate) soap before surgery.  CHG is an antiseptic cleaner which kills germs and bonds with the skin to continue killing germs even after washing. Please DO NOT use if you have an allergy to CHG or antibacterial soaps.  If your skin becomes reddened/irritated stop using the CHG and inform your nurse when you arrive at Short Stay. Do not shave (including legs and underarms) for at least 48 hours prior to the first CHG shower.  You may shave your face/neck. Please follow these instructions  carefully:  1.  Shower with CHG Soap the night before surgery and the  morning of Surgery.  2.  If you choose to wash your hair, wash your hair first as usual with your  normal  shampoo.  3.  After you shampoo, rinse your hair and body thoroughly to remove the  shampoo.                           4.  Use CHG as you would any other liquid soap.  You can apply chg directly  to the skin and wash                       Gently with a scrungie or clean washcloth.  5.  Apply the CHG Soap to your body ONLY FROM THE NECK DOWN.   Do not use on  face/ open                           Wound or open sores. Avoid contact with eyes, ears mouth and genitals (private parts).                       Wash face,  Genitals (private parts) with your normal soap.             6.  Wash thoroughly, paying special attention to the area where your surgery  will be performed.  7.  Thoroughly rinse your body with warm water from the neck down.  8.  DO NOT shower/wash with your normal soap after using and rinsing off  the CHG Soap.                9.  Pat yourself dry with a clean towel.            10.  Wear clean pajamas.            11.  Place clean sheets on your bed the night of your first shower and do not  sleep with pets. Day of Surgery : Do not apply any lotions/deodorants the morning of surgery.  Please wear clean clothes to the hospital/surgery center.  FAILURE TO FOLLOW THESE INSTRUCTIONS MAY RESULT IN THE CANCELLATION OF YOUR SURGERY PATIENT SIGNATURE_________________________________  NURSE SIGNATURE__________________________________  ________________________________________________________________________

## 2017-01-15 ENCOUNTER — Ambulatory Visit (HOSPITAL_COMMUNITY)
Admission: RE | Admit: 2017-01-15 | Discharge: 2017-01-15 | Disposition: A | Discharge status: Home / Self Care | Payer: BC Managed Care – PPO | Source: Ambulatory Visit | Attending: Anesthesiology | Admitting: Anesthesiology

## 2017-01-15 ENCOUNTER — Encounter (HOSPITAL_COMMUNITY): Payer: Self-pay | Admitting: Surgery

## 2017-01-15 ENCOUNTER — Encounter (HOSPITAL_COMMUNITY): Payer: Self-pay

## 2017-01-15 ENCOUNTER — Other Ambulatory Visit: Payer: Self-pay

## 2017-01-15 ENCOUNTER — Encounter (INDEPENDENT_AMBULATORY_CARE_PROVIDER_SITE_OTHER): Payer: Self-pay

## 2017-01-15 ENCOUNTER — Encounter (HOSPITAL_COMMUNITY)
Admission: RE | Admit: 2017-01-15 | Discharge: 2017-01-15 | Disposition: A | Discharge status: Home / Self Care | Payer: BC Managed Care – PPO | Source: Ambulatory Visit | Attending: Surgery | Admitting: Surgery

## 2017-01-15 DIAGNOSIS — E041 Nontoxic single thyroid nodule: Secondary | ICD-10-CM | POA: Insufficient documentation

## 2017-01-15 DIAGNOSIS — Z01818 Encounter for other preprocedural examination: Secondary | ICD-10-CM | POA: Insufficient documentation

## 2017-01-15 LAB — CBC
HEMATOCRIT: 30.4 % — AB (ref 36.0–46.0)
HEMOGLOBIN: 10 g/dL — AB (ref 12.0–15.0)
MCH: 25.6 pg — ABNORMAL LOW (ref 26.0–34.0)
MCHC: 32.9 g/dL (ref 30.0–36.0)
MCV: 77.9 fL — ABNORMAL LOW (ref 78.0–100.0)
Platelets: 295 10*3/uL (ref 150–400)
RBC: 3.9 MIL/uL (ref 3.87–5.11)
RDW: 15.4 % (ref 11.5–15.5)
WBC: 5.9 10*3/uL (ref 4.0–10.5)

## 2017-01-15 LAB — BASIC METABOLIC PANEL
ANION GAP: 7 (ref 5–15)
BUN: 10 mg/dL (ref 6–20)
CO2: 27 mmol/L (ref 22–32)
Calcium: 9.2 mg/dL (ref 8.9–10.3)
Chloride: 104 mmol/L (ref 101–111)
Creatinine, Ser: 0.88 mg/dL (ref 0.44–1.00)
GFR calc Af Amer: >60 mL/min (ref >60)
Glucose, Bld: 93 mg/dL (ref 65–99)
POTASSIUM: 3.6 mmol/L (ref 3.5–5.1)
SODIUM: 138 mmol/L (ref 135–145)

## 2017-01-15 LAB — HCG, SERUM, QUALITATIVE: Preg, Serum: NEGATIVE

## 2017-01-17 ENCOUNTER — Ambulatory Visit (HOSPITAL_COMMUNITY): Payer: BC Managed Care – PPO | Admitting: Anesthesiology

## 2017-01-17 ENCOUNTER — Encounter (HOSPITAL_COMMUNITY): Payer: Self-pay | Admitting: *Deleted

## 2017-01-17 ENCOUNTER — Encounter (HOSPITAL_COMMUNITY)
Admission: RE | Disposition: A | Discharge status: Home / Self Care | Payer: Self-pay | Source: Ambulatory Visit | Attending: Surgery

## 2017-01-17 ENCOUNTER — Observation Stay (HOSPITAL_COMMUNITY)
Admission: RE | Admit: 2017-01-17 | Discharge: 2017-01-18 | Disposition: A | Discharge status: Home / Self Care | Payer: BC Managed Care – PPO | Source: Ambulatory Visit | Attending: Surgery | Admitting: Surgery

## 2017-01-17 ENCOUNTER — Other Ambulatory Visit: Payer: Self-pay

## 2017-01-17 DIAGNOSIS — Z79899 Other long term (current) drug therapy: Secondary | ICD-10-CM | POA: Insufficient documentation

## 2017-01-17 DIAGNOSIS — E041 Nontoxic single thyroid nodule: Secondary | ICD-10-CM | POA: Diagnosis present

## 2017-01-17 HISTORY — PX: THYROID LOBECTOMY: SHX420

## 2017-01-17 SURGERY — LOBECTOMY, THYROID
Anesthesia: General | Site: Neck | Laterality: Right

## 2017-01-17 MED ORDER — PROPOFOL 10 MG/ML IV BOLUS
INTRAVENOUS | Status: DC | PRN
  Administered 2017-01-17: 150 mg via INTRAVENOUS

## 2017-01-17 MED ORDER — FENTANYL CITRATE (PF) 100 MCG/2ML IJ SOLN
INTRAMUSCULAR | Status: DC | PRN
  Administered 2017-01-17: 50 ug via INTRAVENOUS
  Administered 2017-01-17: 100 ug via INTRAVENOUS
  Administered 2017-01-17: 50 ug via INTRAVENOUS

## 2017-01-17 MED ORDER — ROCURONIUM BROMIDE 100 MG/10ML IV SOLN
INTRAVENOUS | Status: DC | PRN
  Administered 2017-01-17: 50 mg via INTRAVENOUS

## 2017-01-17 MED ORDER — ONDANSETRON 4 MG PO TBDP
4.0000 mg | ORAL_TABLET | Freq: Four times a day (QID) | ORAL | Status: DC | PRN
  Administered 2017-01-18: 4 mg via ORAL
  Filled 2017-01-17: qty 1

## 2017-01-17 MED ORDER — FENTANYL CITRATE (PF) 100 MCG/2ML IJ SOLN
INTRAMUSCULAR | Status: AC
  Filled 2017-01-17: qty 2

## 2017-01-17 MED ORDER — DEXAMETHASONE SODIUM PHOSPHATE 10 MG/ML IJ SOLN
INTRAMUSCULAR | Status: DC | PRN
  Administered 2017-01-17: 10 mg via INTRAVENOUS

## 2017-01-17 MED ORDER — CHLORHEXIDINE GLUCONATE CLOTH 2 % EX PADS: 6.0000 | MEDICATED_PAD | Freq: Once | CUTANEOUS | Status: DC

## 2017-01-17 MED ORDER — FENTANYL CITRATE (PF) 250 MCG/5ML IJ SOLN
INTRAMUSCULAR | Status: AC
  Filled 2017-01-17: qty 5

## 2017-01-17 MED ORDER — SUGAMMADEX SODIUM 200 MG/2ML IV SOLN
INTRAVENOUS | Status: DC | PRN
  Administered 2017-01-17: 200 mg via INTRAVENOUS

## 2017-01-17 MED ORDER — ONDANSETRON HCL 4 MG/2ML IJ SOLN: 4.0000 mg | Freq: Four times a day (QID) | INTRAMUSCULAR | Status: DC | PRN

## 2017-01-17 MED ORDER — HYDROMORPHONE HCL 1 MG/ML IJ SOLN
1.0000 mg | INTRAMUSCULAR | Status: DC | PRN
  Administered 2017-01-17 – 2017-01-18 (×3): 1 mg via INTRAVENOUS
  Filled 2017-01-17 (×3): qty 1

## 2017-01-17 MED ORDER — LIDOCAINE 2% (20 MG/ML) 5 ML SYRINGE
INTRAMUSCULAR | Status: AC
  Filled 2017-01-17: qty 5

## 2017-01-17 MED ORDER — TRAMADOL HCL 50 MG PO TABS: 50.0000 mg | ORAL_TABLET | Freq: Four times a day (QID) | ORAL | Status: DC | PRN

## 2017-01-17 MED ORDER — ACETAMINOPHEN 325 MG PO TABS: 650.0000 mg | ORAL_TABLET | Freq: Four times a day (QID) | ORAL | Status: DC | PRN

## 2017-01-17 MED ORDER — MIDAZOLAM HCL 5 MG/5ML IJ SOLN
INTRAMUSCULAR | Status: DC | PRN
  Administered 2017-01-17: 2 mg via INTRAVENOUS

## 2017-01-17 MED ORDER — 0.9 % SODIUM CHLORIDE (POUR BTL) OPTIME
TOPICAL | Status: DC | PRN
  Administered 2017-01-17: 1000 mL

## 2017-01-17 MED ORDER — LIDOCAINE HCL (CARDIAC) 20 MG/ML IV SOLN
INTRAVENOUS | Status: DC | PRN
  Administered 2017-01-17: 50 mg via INTRAVENOUS

## 2017-01-17 MED ORDER — ONDANSETRON HCL 4 MG/2ML IJ SOLN
INTRAMUSCULAR | Status: AC
  Filled 2017-01-17: qty 2

## 2017-01-17 MED ORDER — CEFAZOLIN SODIUM-DEXTROSE 2-4 GM/100ML-% IV SOLN
2.0000 g | INTRAVENOUS | Status: AC
  Administered 2017-01-17: 2 g via INTRAVENOUS
  Filled 2017-01-17: qty 100

## 2017-01-17 MED ORDER — ROCURONIUM BROMIDE 50 MG/5ML IV SOSY
PREFILLED_SYRINGE | INTRAVENOUS | Status: AC
  Filled 2017-01-17: qty 5

## 2017-01-17 MED ORDER — MIDAZOLAM HCL 2 MG/2ML IJ SOLN
INTRAMUSCULAR | Status: AC
  Filled 2017-01-17: qty 2

## 2017-01-17 MED ORDER — KCL IN DEXTROSE-NACL 20-5-0.45 MEQ/L-%-% IV SOLN
INTRAVENOUS | Status: DC
  Administered 2017-01-17: 16:00:00 via INTRAVENOUS
  Filled 2017-01-17 (×2): qty 1000

## 2017-01-17 MED ORDER — FENTANYL CITRATE (PF) 100 MCG/2ML IJ SOLN
25.0000 ug | INTRAMUSCULAR | Status: DC | PRN
  Administered 2017-01-17 (×2): 25 ug via INTRAVENOUS
  Administered 2017-01-17 (×3): 50 ug via INTRAVENOUS

## 2017-01-17 MED ORDER — TRAMADOL HCL 50 MG PO TABS: 50.0000 mg | ORAL_TABLET | Freq: Four times a day (QID) | ORAL | 0 refills | Status: DC | PRN

## 2017-01-17 MED ORDER — ONDANSETRON HCL 4 MG/2ML IJ SOLN
INTRAMUSCULAR | Status: DC | PRN
  Administered 2017-01-17: 4 mg via INTRAVENOUS

## 2017-01-17 MED ORDER — LACTATED RINGERS IV SOLN
INTRAVENOUS | Status: DC
  Administered 2017-01-17: 10:00:00 via INTRAVENOUS

## 2017-01-17 MED ORDER — PROPOFOL 10 MG/ML IV BOLUS
INTRAVENOUS | Status: AC
  Filled 2017-01-17: qty 20

## 2017-01-17 MED ORDER — DEXAMETHASONE SODIUM PHOSPHATE 10 MG/ML IJ SOLN
INTRAMUSCULAR | Status: AC
  Filled 2017-01-17: qty 1

## 2017-01-17 MED ORDER — SUGAMMADEX SODIUM 200 MG/2ML IV SOLN
INTRAVENOUS | Status: AC
  Filled 2017-01-17: qty 2

## 2017-01-17 MED ORDER — CEFAZOLIN SODIUM-DEXTROSE 2-4 GM/100ML-% IV SOLN: 2.0000 g | INTRAVENOUS | Status: DC

## 2017-01-17 MED ORDER — HYDROCODONE-ACETAMINOPHEN 5-325 MG PO TABS: 1.0000 | ORAL_TABLET | ORAL | Status: DC | PRN

## 2017-01-17 MED ORDER — ACETAMINOPHEN 650 MG RE SUPP: 650.0000 mg | Freq: Four times a day (QID) | RECTAL | Status: DC | PRN

## 2017-01-17 SURGICAL SUPPLY — 35 items
ATTRACTOMAT 16X20 MAGNETIC DRP (DRAPES) ×3 IMPLANT
BLADE SURG 15 STRL LF DISP TIS (BLADE) ×1 IMPLANT
BLADE SURG 15 STRL SS (BLADE) ×3
CHLORAPREP W/TINT 26ML (MISCELLANEOUS) ×6 IMPLANT
CLIP VESOCCLUDE MED 6/CT (CLIP) ×8 IMPLANT
CLIP VESOCCLUDE SM WIDE 6/CT (CLIP) ×8 IMPLANT
CLOSURE WOUND 1/2 X4 (GAUZE/BANDAGES/DRESSINGS) ×1
DISSECTOR ROUND CHERRY 3/8 STR (MISCELLANEOUS) IMPLANT
DRAPE LAPAROTOMY T 98X78 PEDS (DRAPES) ×3 IMPLANT
ELECT PENCIL ROCKER SW 15FT (MISCELLANEOUS) ×3 IMPLANT
ELECT REM PT RETURN 15FT ADLT (MISCELLANEOUS) ×3 IMPLANT
GAUZE SPONGE 4X4 12PLY STRL (GAUZE/BANDAGES/DRESSINGS) ×2 IMPLANT
GAUZE SPONGE 4X4 16PLY XRAY LF (GAUZE/BANDAGES/DRESSINGS) ×3 IMPLANT
GLOVE SURG ORTHO 8.0 STRL STRW (GLOVE) ×3 IMPLANT
GOWN STRL REUS W/TWL XL LVL3 (GOWN DISPOSABLE) ×6 IMPLANT
HEMOSTAT SURGICEL 2X4 FIBR (HEMOSTASIS) ×3 IMPLANT
ILLUMINATOR WAVEGUIDE N/F (MISCELLANEOUS) IMPLANT
KIT BASIN OR (CUSTOM PROCEDURE TRAY) ×3 IMPLANT
LIGHT WAVEGUIDE WIDE FLAT (MISCELLANEOUS) IMPLANT
PACK BASIC VI WITH GOWN DISP (CUSTOM PROCEDURE TRAY) ×3 IMPLANT
POWDER SURGICEL 3.0 GRAM (HEMOSTASIS) ×2 IMPLANT
SHEARS HARMONIC 9CM CVD (BLADE) ×3 IMPLANT
STAPLER VISISTAT 35W (STAPLE) ×3 IMPLANT
STRIP CLOSURE SKIN 1/2X4 (GAUZE/BANDAGES/DRESSINGS) ×2 IMPLANT
SUT MNCRL AB 4-0 PS2 18 (SUTURE) ×3 IMPLANT
SUT SILK 2 0 (SUTURE) ×3
SUT SILK 2-0 18XBRD TIE 12 (SUTURE) ×1 IMPLANT
SUT SILK 3 0 (SUTURE)
SUT SILK 3-0 18XBRD TIE 12 (SUTURE) IMPLANT
SUT VIC AB 3-0 SH 18 (SUTURE) ×3 IMPLANT
SYR BULB IRRIGATION 50ML (SYRINGE) ×3 IMPLANT
TAPE CLOTH SURG 4X10 WHT LF (GAUZE/BANDAGES/DRESSINGS) ×2 IMPLANT
TOWEL OR 17X26 10 PK STRL BLUE (TOWEL DISPOSABLE) ×3 IMPLANT
TOWEL OR NON WOVEN STRL DISP B (DISPOSABLE) ×3 IMPLANT
YANKAUER SUCT BULB TIP 10FT TU (MISCELLANEOUS) ×3 IMPLANT

## 2017-01-17 NOTE — Op Note (Signed)
Procedure Note  Pre-operative Diagnosis:  Right thyroid nodule with compressive symptoms  Post-operative Diagnosis:  same  Surgeon:  Armandina Gemma, MD  Assistant:  none   Procedure:  Right thyroid lobectomy  Anesthesia:  General  Estimated Blood Loss:  minimal  Drains: none         Specimen: thyroid lobe to pathology  Indications:  Patient is referred by Dr. Jacelyn Pi for surgical evaluation and management of right thyroid nodule. Patient's gynecologist is Dr. Arvella Nigh. Patient was found on physical examination by her gynecologist to have a right sided thyroid mass. She subsequently underwent thyroid ultrasound showing a dominant nodule on the right side measuring 3.4 x 1.6 x 2.2 cm. Left thyroid lobe was normal. Patient underwent biopsy on October 30, 2016. This demonstrated a benign follicular nodule, Bethesda category II. Patient complains of compressive symptoms including discomfort in the right neck radiating towards the ear, occasional shortness of breath and air hunger, intermittent hoarseness, and globus sensation. She denies any significant dysphagia. She has never been on thyroid medication. TSH level was normal at 1.33. She has had no prior head or neck surgery. There is no family history of thyroid disease and no family history of other endocrine neoplasms. Patient was evaluated by endocrinology and referred to surgery for consideration for right thyroid lobectomy for dominant thyroid nodule with compressive symptoms.   Procedure Details: Procedure was done in OR #1 at the [facility].  The patient was brought to the operating room and placed in a supine position on the operating room table.  Following administration of general anesthesia, the patient was positioned and then prepped and draped in the usual aseptic fashion.  After ascertaining that an adequate level of anesthesia had been achieved, a Kocher incision was made with #15 blade.  Dissection was  carried through subcutaneous tissues and platysma. Hemostasis was achieved with the electrocautery.  Skin flaps were elevated cephalad and caudad from the thyroid notch to the sternal notch.  A self-retaining retractor was placed for exposure.  Strap muscles were incised in the midline and dissection was begun on the right side.  Strap muscles were reflected laterally.  The right thyroid lobe was mildly enlarged with a dominant nodule in the inferior pole.  The lobe was gently mobilized with blunt dissection.  Superior pole vessels were dissected out and divided individually between small and medium Ligaclips with the Harmonic scalpel.  The thyroid lobe was rolled anteriorly.  Branches of the inferior thyroid artery were divided between small Ligaclips with the Harmonic scalpel.  Inferior venous tributaries were divided between Ligaclips.  Both the superior and inferior parathyroid glands were identified and preserved on their vascular pedicles.  There was a nodular mass adjacent to the inferior pole, potentially a lymph node.  This was resected separately and submitted to pathology for permanent review. The recurrent laryngeal nerve was identified and preserved along its course.  The ligament of Gwenlyn Found was released with the electrocautery and the gland was mobilized onto the anterior trachea. Isthmus was mobilized across the midline.  There was a small pyramidal lobe present which was resected with the thyroid isthmus.  The thyroid parenchyma was transected at the junction of the isthmus and contralateral thyroid lobe with the Harmonic scalpel.  The thyroid lobe and isthmus were submitted to pathology for review.  The neck was irrigated with warm saline.  Powdered Surgicel was placed throughout the operative field.  Strap muscles were reapproximated in the midline with interrupted 3-0  Vicryl sutures.  Platysma was closed with interrupted 3-0 Vicryl sutures.  Skin was closed with a running 4-0 Monocryl subcuticular  suture.  Wound was washed and dried and steri-strips were applied.  Dry gauze dressing was placed.  The patient was awakened from anesthesia and brought to the recovery room.  The patient tolerated the procedure well.   Armandina Gemma, MD [facility] Office: [phone]

## 2017-01-17 NOTE — Anesthesia Procedure Notes (Signed)
Procedure Name: Intubation Date/Time: 01/17/2017 11:37 AM Performed by: Glory Buff, CRNA Pre-anesthesia Checklist: Patient identified, Emergency Drugs available, Suction available and Patient being monitored Patient Re-evaluated:Patient Re-evaluated prior to induction Oxygen Delivery Method: Circle system utilized Preoxygenation: Pre-oxygenation with 100% oxygen Induction Type: IV induction Ventilation: Mask ventilation without difficulty Laryngoscope Size: Miller and 3 Grade View: Grade I Tube type: Oral Tube size: 7.0 mm Number of attempts: 1 Airway Equipment and Method: Stylet and Oral airway Placement Confirmation: ETT inserted through vocal cords under direct vision,  positive ETCO2 and breath sounds checked- equal and bilateral Secured at: 20 cm Tube secured with: Tape Dental Injury: Teeth and Oropharynx as per pre-operative assessment

## 2017-01-17 NOTE — Transfer of Care (Signed)
Immediate Anesthesia Transfer of Care Note  Patient: Shelia Hamilton  Procedure(s) Performed: RIGHT THYROID LOBECTOMY (Right Neck)  Patient Location: PACU  Anesthesia Type:General  Level of Consciousness: awake, alert  and oriented  Airway & Oxygen Therapy: Patient Spontanous Breathing and Patient connected to face mask oxygen  Post-op Assessment: Report given to RN and Post -op Vital signs reviewed and stable  Post vital signs: Reviewed and stable  Last Vitals:  Vitals:   01/17/17 0859  BP: 120/71  Pulse: 67  Resp: 16  Temp: 36.8 C  SpO2: 100%    Last Pain:  Vitals:   01/17/17 0859  TempSrc: Oral         Complications: No apparent anesthesia complications

## 2017-01-17 NOTE — Discharge Instructions (Signed)
[facility] ° °THYROID & PARATHYROID SURGERY:  POST-OP INSTRUCTIONS ° °Always review your discharge instruction sheet from the facility where your surgery was performed. ° °A prescription for pain medication may be given to you upon discharge.  Take your pain medication as prescribed.  If narcotic pain medicine is not needed, then you may take acetaminophen (Tylenol) or ibuprofen (Advil) as needed. ° °Take your usually prescribed medications unless otherwise directed. ° °If you need a refill on your pain medication, please contact our office during regular business hours.  Prescriptions will not be processed by our office after 5 pm or on weekends. ° °Start with a light diet upon arrival home, such as soup and crackers or toast.  Be sure to drink plenty of fluids daily.  Resume your normal diet the day after surgery. ° °Most patients will experience some swelling and bruising on the chest and neck area.  Ice packs will help.  Swelling and bruising can take several days to resolve.  ° °It is common to experience some constipation after surgery.  Increasing fluid intake and taking a stool softener (Colace) will usually help or prevent this problem.  A mild laxative (Milk of Magnesia or Miralax) should be taken according to package directions if there has been no bowel movement after 48 hours. ° °You have steri-strips and a gauze dressing over your incision.  You may remove the gauze bandage on the second day after surgery, and you may shower at that time.  Leave your steri-strips (small skin tapes) in place directly over the incision.  These strips should remain on the skin for 5-7 days and then be removed.  You may get them wet in the shower and pat them dry. ° °You may resume regular (light) daily activities beginning the next day - such as daily self-care, walking, climbing stairs - gradually increasing activities as tolerated.  You may have sexual intercourse when it is comfortable.  Refrain  from any heavy lifting or straining until approved by your doctor.  You may drive when you no longer are taking prescription pain medication, you can comfortably wear a seatbelt, and you can safely maneuver your car and apply brakes. ° °You should see your doctor in the office for a follow-up appointment approximately three weeks after your surgery.  Make sure that you call for this appointment within a day or two after you arrive home to insure a convenient appointment time. ° °WHEN TO CALL YOUR DOCTOR: °-- Fever greater than 101.5 °-- Inability to urinate °-- Nausea and/or vomiting - persistent °-- Extreme swelling or bruising °-- Continued bleeding from incision °-- Increased pain, redness, or drainage from the incision °-- Difficulty swallowing or breathing °-- Muscle cramping or spasms °-- Numbness or tingling in hands or around lips ° °The clinic staff is available to answer your questions during regular business hours.  Please don’t hesitate to call and ask to speak to one of the nurses if you have concerns. ° °Larnce Schnackenberg M. Jowanna Loeffler, MD, FACS °General & Endocrine Surgery °[facility] °Office: [phone] ° °Website: [email or web address] ° ° °

## 2017-01-17 NOTE — Anesthesia Preprocedure Evaluation (Addendum)
Anesthesia Evaluation  Patient identified by MRN, date of birth, ID band Patient awake    Reviewed: Allergy & Precautions, H&P , Patient's Chart, lab work & pertinent test results, reviewed documented beta blocker date and time   Airway Mallampati: II  TM Distance: >3 FB Neck ROM: full    Dental no notable dental hx.    Pulmonary    Pulmonary exam normal breath sounds clear to auscultation       Cardiovascular  Rhythm:regular Rate:Normal     Neuro/Psych    GI/Hepatic   Endo/Other    Renal/GU      Musculoskeletal   Abdominal   Peds  Hematology   Anesthesia Other Findings   Reproductive/Obstetrics                             Anesthesia Physical Anesthesia Plan  ASA: II  Anesthesia Plan: General   Post-op Pain Management:    Induction: Intravenous  PONV Risk Score and Plan: 2 and Dexamethasone, Ondansetron and Treatment may vary due to age or medical condition  Airway Management Planned: Oral ETT  Additional Equipment:   Intra-op Plan:   Post-operative Plan: Extubation in OR  Informed Consent: I have reviewed the patients History and Physical, chart, labs and discussed the procedure including the risks, benefits and alternatives for the proposed anesthesia with the patient or authorized representative who has indicated his/her understanding and acceptance.   Dental Advisory Given  Plan Discussed with: CRNA and Surgeon  Anesthesia Plan Comments: (  )        Anesthesia Quick Evaluation

## 2017-01-17 NOTE — Interval H&P Note (Signed)
History and Physical Interval Note:  01/17/2017 11:05 AM  Shelia Hamilton  has presented today for surgery, with the diagnosis of right thyroid nodule.  The various methods of treatment have been discussed with the patient and family. After consideration of risks, benefits and other options for treatment, the patient has consented to    Procedure(s): RIGHT THYROID LOBECTOMY (Right) as a surgical intervention .    The patient's history has been reviewed, patient examined, no change in status, stable for surgery.  I have reviewed the patient's chart and labs.  Questions were answered to the patient's satisfaction.    Armandina Gemma, [place] [facility] Office: [place]

## 2017-01-18 DIAGNOSIS — E041 Nontoxic single thyroid nodule: Secondary | ICD-10-CM | POA: Diagnosis not present

## 2017-01-18 NOTE — Progress Notes (Signed)
Assessment unchanged. Pt and sister verbalized understanding of dc instructions through teach back including when to call MD, follow up care, activity to resume, and diet. Script x 1 given as provided by MD. Discharged via wc to front entrance accompanied by sister and NT.

## 2017-01-18 NOTE — Progress Notes (Signed)
Patient ID: Emryn Flanery, female   DOB: 03-05-1970, 46 y.o.   MRN: [id number] 1 Day Post-Op   Subjective: No complaints this morning except for occasional mild nausea.  Tolerating fluids.  Objective: Vital signs in last 24 hours: Temp:  [97.3 F (36.3 C)-98.2 F (36.8 C)] 98 F (36.7 C) (12/29 0550) Pulse Rate:  [54-79] 63 (12/29 0550) Resp:  [16-20] 18 (12/29 0550) BP: (105-156)/(65-88) 105/65 (12/29 0550) SpO2:  [97 %-100 %] 97 % (12/29 0550) Weight:  [88.5 kg (195 lb)] 88.5 kg (195 lb) (12/28 0934)    Intake/Output from previous day: 12/28 0701 - 12/29 0700 In: 2292.5 [P.O.:565; I.V.:1627.5; IV Piggyback:100] Out: 10 [Blood:10] Intake/Output this shift: Total I/O In: 600 [P.O.:600] Out: -   General appearance: alert, cooperative, no distress and Voice normal Incision/Wound: No swelling or drainage or bleeding  Lab Results:  Recent Labs    01/15/17 0955  WBC 5.9  HGB 10.0*  HCT 30.4*  PLT 295   BMET Recent Labs    01/15/17 0955  NA 138  K 3.6  CL 104  CO2 27  GLUCOSE 93  BUN 10  CREATININE 0.88  CALCIUM 9.2     Studies/Results: No results found.  Anti-infectives: Anti-infectives (From admission, onward)   Start     Dose/Rate Route Frequency Ordered Stop   01/17/17 0901  ceFAZolin (ANCEF) IVPB 2g/100 mL premix  Status:  Discontinued     2 g 200 mL/hr over 30 Minutes Intravenous On call to O.R. 01/17/17 0901 01/17/17 0910   01/17/17 0901  ceFAZolin (ANCEF) IVPB 2g/100 mL premix     2 g 200 mL/hr over 30 Minutes Intravenous On call to O.R. 01/17/17 0901 01/17/17 1209      Assessment/Plan: s/p Procedure(s): RIGHT THYROID LOBECTOMY Doing well without apparent complication.  Okay for discharge.   LOS: 0 days    Edward Jolly 01/18/2017

## 2017-01-19 NOTE — Anesthesia Postprocedure Evaluation (Signed)
Anesthesia Post Note  Patient: Shelia Hamilton  Procedure(s) Performed: RIGHT THYROID LOBECTOMY (Right Neck)     Patient location during evaluation: PACU Anesthesia Type: General Level of consciousness: awake and alert Pain management: pain level controlled Vital Signs Assessment: post-procedure vital signs reviewed and stable Respiratory status: spontaneous breathing, nonlabored ventilation, respiratory function stable and patient connected to nasal cannula oxygen Cardiovascular status: blood pressure returned to baseline and stable Postop Assessment: no apparent nausea or vomiting Anesthetic complications: no    Last Vitals:  Vitals:   01/17/17 1800 01/18/17 0550  BP: 117/70 105/65  Pulse: 76 63  Resp: 18 18  Temp: 36.7 C 36.7 C  SpO2: 98% 97%    Last Pain:  Vitals:   01/18/17 0915  TempSrc:   PainSc: 0-No pain                 Lakeyta Vandenheuvel EDWARD

## 2017-01-20 ENCOUNTER — Encounter (HOSPITAL_COMMUNITY): Payer: Self-pay | Admitting: Surgery

## 2017-01-22 NOTE — Progress Notes (Signed)
Please contact patient and notify of benign pathology results.  Milka Windholz M. Sayre Witherington, MD, FACS [facility] Office: [phone]   

## 2017-01-22 NOTE — Discharge Summary (Signed)
Physician Discharge Summary [facility]  Patient ID: Shelia Hamilton MRN: [id number] DOB/AGE: [birth year] 47 y.o.  Admit date: 01/17/2017 Discharge date: 01/18/17   Admission Diagnoses:  Right thyroid nodule  Discharge Diagnoses:  Principal Problem:   Right thyroid nodule   Discharged Condition: good  Hospital Course: Patient was admitted for observation following thyroid surgery.  Post op course was uncomplicated.  Pain was well controlled.  Tolerated diet.  Patient was prepared for discharge home on POD#1.  Consults: None  Treatments: surgery: right thyroid lobectomy  Discharge Exam: Blood pressure 105/65, pulse 63, temperature 98 F (36.7 C), temperature source Oral, resp. rate 18, height 5\' 3"  (1.6 m), weight 88.5 kg (195 lb), last menstrual period 01/05/2017, SpO2 97 %. See progress note on day of discharge.  Disposition: Home   Allergies as of 01/18/2017   No Known Allergies     Medication List    TAKE these medications   ALPRAZolam 0.5 MG tablet Commonly known as:  XANAX Take 1 tablet (0.5 mg total) by mouth 3 (three) times daily as needed for anxiety.   traMADol 50 MG tablet Commonly known as:  ULTRAM Take 1-2 tablets (50-100 mg total) by mouth every 6 (six) hours as needed for moderate pain or severe pain.      Follow-up Information    Armandina Gemma, MD. Schedule an appointment as soon as possible for a visit in 3 weeks.   Specialty:  General Surgery Why:  For wound re-check Contact information: [place] [address] [phone]           Earnstine Regal, MD, [facility] Office: [phone]   Signed: Earnstine Regal 01/22/2017, 7:00 AM

## 2017-02-06 ENCOUNTER — Inpatient Hospital Stay (HOSPITAL_COMMUNITY): Admission: RE | Admit: 2017-02-06 | Payer: BC Managed Care – PPO | Source: Ambulatory Visit

## 2017-03-18 ENCOUNTER — Encounter (HOSPITAL_COMMUNITY): Payer: Self-pay

## 2017-03-19 ENCOUNTER — Inpatient Hospital Stay (HOSPITAL_COMMUNITY): Admission: RE | Admit: 2017-03-19 | Payer: BC Managed Care – PPO | Source: Ambulatory Visit

## 2017-03-19 HISTORY — DX: Vitamin D deficiency, unspecified: E55.9

## 2017-03-19 HISTORY — DX: Personal history of other specified conditions: Z87.898

## 2017-03-19 HISTORY — DX: Anemia, unspecified: D64.9

## 2017-03-19 HISTORY — DX: Personal history of other diseases of the female genital tract: Z87.42

## 2017-03-19 HISTORY — DX: Nontoxic single thyroid nodule: E04.1

## 2017-03-21 ENCOUNTER — Other Ambulatory Visit: Payer: Self-pay

## 2017-03-21 ENCOUNTER — Encounter (HOSPITAL_COMMUNITY): Payer: Self-pay

## 2017-03-21 ENCOUNTER — Encounter (HOSPITAL_COMMUNITY)
Admission: RE | Admit: 2017-03-21 | Discharge: 2017-03-21 | Disposition: A | Discharge status: Home / Self Care | Payer: BC Managed Care – PPO | Source: Ambulatory Visit | Attending: Obstetrics and Gynecology | Admitting: Obstetrics and Gynecology

## 2017-03-21 DIAGNOSIS — Z01812 Encounter for preprocedural laboratory examination: Secondary | ICD-10-CM | POA: Diagnosis not present

## 2017-03-21 LAB — HCG, SERUM, QUALITATIVE: PREG SERUM: NEGATIVE

## 2017-03-21 LAB — CBC
HCT: 28.7 % — ABNORMAL LOW (ref 36.0–46.0)
HEMOGLOBIN: 9.2 g/dL — AB (ref 12.0–15.0)
MCH: 24.9 pg — ABNORMAL LOW (ref 26.0–34.0)
MCHC: 32.1 g/dL (ref 30.0–36.0)
MCV: 77.6 fL — ABNORMAL LOW (ref 78.0–100.0)
PLATELETS: 323 10*3/uL (ref 150–400)
RBC: 3.7 MIL/uL — ABNORMAL LOW (ref 3.87–5.11)
RDW: 15.9 % — AB (ref 11.5–15.5)
WBC: 4.9 10*3/uL (ref 4.0–10.5)

## 2017-03-21 LAB — ABO/RH: ABO/RH(D): O POS

## 2017-03-21 NOTE — Patient Instructions (Signed)
Your procedure is scheduled on: Monday, March 24, 2017   Surgery Time:  7:30-9:30AM   Report to [facility] by 5:30AM   Call this number if you have problems the morning of surgery [phone]   Do not eat food or drink liquids :After Midnight.   Do NOT smoke after Midnight    Take these medicines the morning of surgery with A SIP OF WATER: None                               You may not have any metal on your body including hair pins, jewelry, and body piercings             Do not wear make-up, lotions, powders, perfumes/cologne, or deodorant             Do not wear nail polish.  Do not shave  48 hours prior to surgery.         Do not bring valuables to the hospital. [place].   Contacts, dentures or bridgework may not be worn into surgery.   Leave suitcase in the car. After surgery it may be brought to your room.    Special Instructions: Bring a copy of your healthcare power of attorney and living will documents         the day of surgery if you haven't scanned them in before.              Please read over the following fact sheets you were given:   [facility] - Preparing for Surgery Before surgery, you can play an important role.  Because skin is not sterile, your skin needs to be as free of germs as possible.  You can reduce the number of germs on your skin by washing with CHG (chlorahexidine gluconate) soap before surgery.  CHG is an antiseptic cleaner which kills germs and bonds with the skin to continue killing germs even after washing. Please DO NOT use if you have an allergy to CHG or antibacterial soaps.  If your skin becomes reddened/irritated stop using the CHG and inform your nurse when you arrive at Short Stay. Do not shave (including legs and underarms) for at least 48 hours prior to the first CHG shower.  You may shave your face/neck.  Please follow these instructions carefully:  1.  Shower with CHG  Soap the night before surgery and the  morning of surgery.  2.  If you choose to wash your hair, wash your hair first as usual with your normal  shampoo.  3.  After you shampoo, rinse your hair and body thoroughly to remove the shampoo.                             4.  Use CHG as you would any other liquid soap.  You can apply chg directly to the skin and wash.  Gently with a scrungie or clean washcloth.  5.  Apply the CHG Soap to your body ONLY FROM THE NECK DOWN.   Do   not use on face/ open                           Wound or open sores. Avoid contact  with eyes, ears mouth and   genitals (private parts).                       Wash face,  Genitals (private parts) with your normal soap.             6.  Wash thoroughly, paying special attention to the area where your    surgery  will be performed.  7.  Thoroughly rinse your body with warm water from the neck down.  8.  DO NOT shower/wash with your normal soap after using and rinsing off the CHG Soap.                9.  Pat yourself dry with a clean towel.            10.  Wear clean pajamas.            11.  Place clean sheets on your bed the night of your first shower and do not  sleep with pets. Day of Surgery : Do not apply any lotions/deodorants the morning of surgery.  Please wear clean clothes to the hospital/surgery center.  FAILURE TO FOLLOW THESE INSTRUCTIONS MAY RESULT IN THE CANCELLATION OF YOUR SURGERY  PATIENT SIGNATURE_________________________________  NURSE SIGNATURE__________________________________  ________________________________________________________________________   Adam Phenix  An incentive spirometer is a tool that can help keep your lungs clear and active. This tool measures how well you are filling your lungs with each breath. Taking long deep breaths may help reverse or decrease the chance of developing breathing (pulmonary) problems (especially infection) following:  A long period of time when you are  unable to move or be active. BEFORE THE PROCEDURE   If the spirometer includes an indicator to show your best effort, your nurse or respiratory therapist will set it to a desired goal.  If possible, sit up straight or lean slightly forward. Try not to slouch.  Hold the incentive spirometer in an upright position. INSTRUCTIONS FOR USE  1. Sit on the edge of your bed if possible, or sit up as far as you can in bed or on a chair. 2. Hold the incentive spirometer in an upright position. 3. Breathe out normally. 4. Place the mouthpiece in your mouth and seal your lips tightly around it. 5. Breathe in slowly and as deeply as possible, raising the piston or the ball toward the top of the column. 6. Hold your breath for 3-5 seconds or for as long as possible. Allow the piston or ball to fall to the bottom of the column. 7. Remove the mouthpiece from your mouth and breathe out normally. 8. Rest for a few seconds and repeat Steps 1 through 7 at least 10 times every 1-2 hours when you are awake. Take your time and take a few normal breaths between deep breaths. 9. The spirometer may include an indicator to show your best effort. Use the indicator as a goal to work toward during each repetition. 10. After each set of 10 deep breaths, practice coughing to be sure your lungs are clear. If you have an incision (the cut made at the time of surgery), support your incision when coughing by placing a pillow or rolled up towels firmly against it. Once you are able to get out of bed, walk around indoors and cough well. You may stop using the incentive spirometer when instructed by your caregiver.  RISKS AND COMPLICATIONS  Take your time so you do not get dizzy  or light-headed.  If you are in pain, you may need to take or ask for pain medication before doing incentive spirometry. It is harder to take a deep breath if you are having pain. AFTER USE  Rest and breathe slowly and easily.  It can be helpful to  keep track of a log of your progress. Your caregiver can provide you with a simple table to help with this. If you are using the spirometer at home, follow these instructions: [place] IF:   You are having difficultly using the spirometer.  You have trouble using the spirometer as often as instructed.  Your pain medication is not giving enough relief while using the spirometer.  You develop fever of 100.5 F (38.1 C) or higher. SEEK IMMEDIATE MEDICAL CARE IF:   You cough up bloody sputum that had not been present before.  You develop fever of 102 F (38.9 C) or greater.  You develop worsening pain at or near the incision site. MAKE SURE YOU:   Understand these instructions.  Will watch your condition.  Will get help right away if you are not doing well or get worse. Document Released: 05/20/2006 Document Revised: 04/01/2011 Document Reviewed: 07/21/2006 ExitCare Patient Information 2014 ExitCare, [place].   ________________________________________________________________________  WHAT IS A BLOOD TRANSFUSION? Blood Transfusion Information  A transfusion is the replacement of blood or some of its parts. Blood is made up of multiple cells which provide different functions.  Red blood cells carry oxygen and are used for blood loss replacement.  White blood cells fight against infection.  Platelets control bleeding.  Plasma helps clot blood.  Other blood products are available for specialized needs, such as hemophilia or other clotting disorders. BEFORE THE TRANSFUSION  Who gives blood for transfusions?   Healthy volunteers who are fully evaluated to make sure their blood is safe. This is blood bank blood. Transfusion therapy is the safest it has ever been in the practice of medicine. Before blood is taken from a donor, a complete history is taken to make sure that person has no history of diseases nor engages in risky social behavior (examples are intravenous drug  use or sexual activity with multiple partners). The donor's travel history is screened to minimize risk of transmitting infections, such as malaria. The donated blood is tested for signs of infectious diseases, such as HIV and hepatitis. The blood is then tested to be sure it is compatible with you in order to minimize the chance of a transfusion reaction. If you or a relative donates blood, this is often done in anticipation of surgery and is not appropriate for emergency situations. It takes many days to process the donated blood. RISKS AND COMPLICATIONS Although transfusion therapy is very safe and saves many lives, the main dangers of transfusion include:   Getting an infectious disease.  Developing a transfusion reaction. This is an allergic reaction to something in the blood you were given. Every precaution is taken to prevent this. The decision to have a blood transfusion has been considered carefully by your caregiver before blood is given. Blood is not given unless the benefits outweigh the risks. AFTER THE TRANSFUSION  Right after receiving a blood transfusion, you will usually feel much better and more energetic. This is especially true if your red blood cells have gotten low (anemic). The transfusion raises the level of the red blood cells which carry oxygen, and this usually causes an energy increase.  The nurse administering the transfusion will monitor  you carefully for complications. HOME CARE INSTRUCTIONS  No special instructions are needed after a transfusion. You may find your energy is better. Speak with your caregiver about any limitations on activity for underlying diseases you may have. SEEK MEDICAL CARE IF:   Your condition is not improving after your transfusion.  You develop redness or irritation at the intravenous (IV) site. SEEK IMMEDIATE MEDICAL CARE IF:  Any of the following symptoms occur over the next 12 hours:  Shaking chills.  You have a temperature by mouth  above 102 F (38.9 C), not controlled by medicine.  Chest, back, or muscle pain.  People around you feel you are not acting correctly or are confused.  Shortness of breath or difficulty breathing.  Dizziness and fainting.  You get a rash or develop hives.  You have a decrease in urine output.  Your urine turns a dark color or changes to pink, red, or brown. Any of the following symptoms occur over the next 10 days:  You have a temperature by mouth above 102 F (38.9 C), not controlled by medicine.  Shortness of breath.  Weakness after normal activity.  The white part of the eye turns yellow (jaundice).  You have a decrease in the amount of urine or are urinating less often.  Your urine turns a dark color or changes to pink, red, or brown. Document Released: 01/05/2000 Document Revised: 04/01/2011 Document Reviewed: 08/24/2007 [facility] Patient Information 2014 [place], [place].  _______________________________________________________________________

## 2017-03-21 NOTE — Pre-Procedure Instructions (Signed)
CBC results 03/21/2017 faxed to Dr. Radene Knee via epic.

## 2017-03-21 NOTE — Pre-Procedure Instructions (Signed)
The following in epic: CXR 01/15/2017

## 2017-03-24 ENCOUNTER — Encounter (HOSPITAL_BASED_OUTPATIENT_CLINIC_OR_DEPARTMENT_OTHER): Payer: Self-pay

## 2017-03-24 ENCOUNTER — Ambulatory Visit (HOSPITAL_BASED_OUTPATIENT_CLINIC_OR_DEPARTMENT_OTHER): Payer: BC Managed Care – PPO | Admitting: Anesthesiology

## 2017-03-24 ENCOUNTER — Observation Stay (HOSPITAL_BASED_OUTPATIENT_CLINIC_OR_DEPARTMENT_OTHER)
Admission: RE | Admit: 2017-03-24 | Discharge: 2017-03-25 | Disposition: A | Discharge status: Home / Self Care | Payer: BC Managed Care – PPO | Source: Ambulatory Visit | Attending: Obstetrics and Gynecology | Admitting: Obstetrics and Gynecology

## 2017-03-24 ENCOUNTER — Encounter (HOSPITAL_BASED_OUTPATIENT_CLINIC_OR_DEPARTMENT_OTHER)
Admission: RE | Disposition: A | Discharge status: Home / Self Care | Payer: Self-pay | Source: Ambulatory Visit | Attending: Obstetrics and Gynecology

## 2017-03-24 DIAGNOSIS — N939 Abnormal uterine and vaginal bleeding, unspecified: Secondary | ICD-10-CM | POA: Diagnosis present

## 2017-03-24 DIAGNOSIS — N879 Dysplasia of cervix uteri, unspecified: Principal | ICD-10-CM | POA: Insufficient documentation

## 2017-03-24 DIAGNOSIS — N7011 Chronic salpingitis: Secondary | ICD-10-CM | POA: Diagnosis not present

## 2017-03-24 DIAGNOSIS — D251 Intramural leiomyoma of uterus: Secondary | ICD-10-CM | POA: Diagnosis not present

## 2017-03-24 DIAGNOSIS — N838 Other noninflammatory disorders of ovary, fallopian tube and broad ligament: Secondary | ICD-10-CM | POA: Diagnosis not present

## 2017-03-24 DIAGNOSIS — Z90710 Acquired absence of both cervix and uterus: Secondary | ICD-10-CM | POA: Diagnosis present

## 2017-03-24 HISTORY — PX: BILATERAL SALPINGECTOMY: SHX5743

## 2017-03-24 HISTORY — PX: ABDOMINAL HYSTERECTOMY: SHX81

## 2017-03-24 LAB — TYPE AND SCREEN
ABO/RH(D): O POS
Antibody Screen: NEGATIVE

## 2017-03-24 SURGERY — HYSTERECTOMY, ABDOMINAL
Anesthesia: General | Site: Abdomen | Laterality: Bilateral

## 2017-03-24 MED ORDER — NALOXONE HCL 0.4 MG/ML IJ SOLN
0.4000 mg | INTRAMUSCULAR | Status: DC | PRN
Start: 2017-03-24 — End: 2017-03-25
  Filled 2017-03-24: qty 1

## 2017-03-24 MED ORDER — FENTANYL CITRATE (PF) 100 MCG/2ML IJ SOLN
25.0000 ug | INTRAMUSCULAR | Status: DC | PRN
  Administered 2017-03-24 (×2): 50 ug via INTRAVENOUS
  Filled 2017-03-24: qty 1

## 2017-03-24 MED ORDER — LIDOCAINE 2% (20 MG/ML) 5 ML SYRINGE
INTRAMUSCULAR | Status: AC
  Filled 2017-03-24: qty 5

## 2017-03-24 MED ORDER — ONDANSETRON HCL 4 MG/2ML IJ SOLN
4.0000 mg | Freq: Four times a day (QID) | INTRAMUSCULAR | Status: DC | PRN
  Filled 2017-03-24: qty 2

## 2017-03-24 MED ORDER — EPHEDRINE 5 MG/ML INJ
INTRAVENOUS | Status: AC
  Filled 2017-03-24: qty 10

## 2017-03-24 MED ORDER — ACETAMINOPHEN 325 MG PO TABS
650.0000 mg | ORAL_TABLET | ORAL | Status: DC | PRN
  Filled 2017-03-24: qty 2

## 2017-03-24 MED ORDER — KETOROLAC TROMETHAMINE 30 MG/ML IJ SOLN
INTRAMUSCULAR | Status: DC | PRN
  Administered 2017-03-24: 30 mg via INTRAVENOUS

## 2017-03-24 MED ORDER — PROPOFOL 10 MG/ML IV BOLUS
INTRAVENOUS | Status: DC | PRN
  Administered 2017-03-24: 200 mg via INTRAVENOUS

## 2017-03-24 MED ORDER — ACETAMINOPHEN 10 MG/ML IV SOLN
INTRAVENOUS | Status: AC
  Filled 2017-03-24: qty 100

## 2017-03-24 MED ORDER — HYDROMORPHONE 1 MG/ML IV SOLN
INTRAVENOUS | Status: DC
  Administered 2017-03-24: 1 mg via INTRAVENOUS
  Administered 2017-03-24: 0.8 mg via INTRAVENOUS
  Administered 2017-03-24: 1.4 mg via INTRAVENOUS
  Administered 2017-03-24: 11:00:00 via INTRAVENOUS
  Filled 2017-03-24 (×2): qty 25

## 2017-03-24 MED ORDER — ONDANSETRON HCL 4 MG PO TABS
4.0000 mg | ORAL_TABLET | Freq: Four times a day (QID) | ORAL | Status: DC | PRN
  Filled 2017-03-24: qty 1

## 2017-03-24 MED ORDER — MENTHOL 3 MG MT LOZG
1.0000 | LOZENGE | OROMUCOSAL | Status: DC | PRN
  Filled 2017-03-24: qty 9

## 2017-03-24 MED ORDER — MIDAZOLAM HCL 2 MG/2ML IJ SOLN
INTRAMUSCULAR | Status: DC | PRN
  Administered 2017-03-24: 2 mg via INTRAVENOUS

## 2017-03-24 MED ORDER — EPHEDRINE SULFATE-NACL 50-0.9 MG/10ML-% IV SOSY
PREFILLED_SYRINGE | INTRAVENOUS | Status: DC | PRN
  Administered 2017-03-24 (×2): 10 mg via INTRAVENOUS

## 2017-03-24 MED ORDER — OXYCODONE-ACETAMINOPHEN 5-325 MG PO TABS
1.0000 | ORAL_TABLET | ORAL | Status: DC | PRN
  Administered 2017-03-24 – 2017-03-25 (×2): 1 via ORAL
  Filled 2017-03-24: qty 1

## 2017-03-24 MED ORDER — KETOROLAC TROMETHAMINE 30 MG/ML IJ SOLN
INTRAMUSCULAR | Status: AC
  Filled 2017-03-24: qty 1

## 2017-03-24 MED ORDER — ROCURONIUM BROMIDE 10 MG/ML (PF) SYRINGE
PREFILLED_SYRINGE | INTRAVENOUS | Status: DC | PRN
  Administered 2017-03-24: 50 mg via INTRAVENOUS

## 2017-03-24 MED ORDER — ONDANSETRON HCL 4 MG/2ML IJ SOLN
INTRAMUSCULAR | Status: AC
  Filled 2017-03-24: qty 2

## 2017-03-24 MED ORDER — DEXAMETHASONE SODIUM PHOSPHATE 10 MG/ML IJ SOLN
INTRAMUSCULAR | Status: DC | PRN
  Administered 2017-03-24: 10 mg via INTRAVENOUS

## 2017-03-24 MED ORDER — MIDAZOLAM HCL 2 MG/2ML IJ SOLN
INTRAMUSCULAR | Status: AC
  Filled 2017-03-24: qty 2

## 2017-03-24 MED ORDER — ACETAMINOPHEN 10 MG/ML IV SOLN
INTRAVENOUS | Status: DC | PRN
  Administered 2017-03-24: 1000 mg via INTRAVENOUS

## 2017-03-24 MED ORDER — OXYCODONE-ACETAMINOPHEN 5-325 MG PO TABS
ORAL_TABLET | ORAL | Status: AC
  Filled 2017-03-24: qty 1

## 2017-03-24 MED ORDER — LACTATED RINGERS IV SOLN
INTRAVENOUS | Status: DC
  Administered 2017-03-24 – 2017-03-25 (×3): via INTRAVENOUS
  Filled 2017-03-24 (×3): qty 1000

## 2017-03-24 MED ORDER — CEFAZOLIN SODIUM-DEXTROSE 2-4 GM/100ML-% IV SOLN
2.0000 g | INTRAVENOUS | Status: AC
  Administered 2017-03-24: 2 g via INTRAVENOUS
  Filled 2017-03-24: qty 100

## 2017-03-24 MED ORDER — IBUPROFEN 800 MG PO TABS
800.0000 mg | ORAL_TABLET | Freq: Four times a day (QID) | ORAL | Status: DC | PRN
  Administered 2017-03-24 – 2017-03-25 (×2): 800 mg via ORAL
  Filled 2017-03-24: qty 1

## 2017-03-24 MED ORDER — FENTANYL CITRATE (PF) 250 MCG/5ML IJ SOLN
INTRAMUSCULAR | Status: AC
  Filled 2017-03-24: qty 5

## 2017-03-24 MED ORDER — DEXAMETHASONE SODIUM PHOSPHATE 10 MG/ML IJ SOLN
INTRAMUSCULAR | Status: AC
  Filled 2017-03-24: qty 1

## 2017-03-24 MED ORDER — LIDOCAINE 2% (20 MG/ML) 5 ML SYRINGE
INTRAMUSCULAR | Status: DC | PRN
  Administered 2017-03-24: 100 mg via INTRAVENOUS

## 2017-03-24 MED ORDER — SUGAMMADEX SODIUM 200 MG/2ML IV SOLN
INTRAVENOUS | Status: DC | PRN
  Administered 2017-03-24: 200 mg via INTRAVENOUS

## 2017-03-24 MED ORDER — ROCURONIUM BROMIDE 10 MG/ML (PF) SYRINGE
PREFILLED_SYRINGE | INTRAVENOUS | Status: AC
  Filled 2017-03-24: qty 5

## 2017-03-24 MED ORDER — IBUPROFEN 200 MG PO TABS
ORAL_TABLET | ORAL | Status: AC
  Filled 2017-03-24: qty 4

## 2017-03-24 MED ORDER — SCOPOLAMINE 1 MG/3DAYS TD PT72
MEDICATED_PATCH | TRANSDERMAL | Status: AC
  Filled 2017-03-24: qty 1

## 2017-03-24 MED ORDER — FENTANYL CITRATE (PF) 100 MCG/2ML IJ SOLN
INTRAMUSCULAR | Status: AC
  Filled 2017-03-24: qty 2

## 2017-03-24 MED ORDER — SUGAMMADEX SODIUM 200 MG/2ML IV SOLN
INTRAVENOUS | Status: AC
  Filled 2017-03-24: qty 2

## 2017-03-24 MED ORDER — FENTANYL CITRATE (PF) 100 MCG/2ML IJ SOLN
INTRAMUSCULAR | Status: DC | PRN
  Administered 2017-03-24 (×3): 50 ug via INTRAVENOUS

## 2017-03-24 MED ORDER — CEFAZOLIN SODIUM-DEXTROSE 2-4 GM/100ML-% IV SOLN
2.0000 g | INTRAVENOUS | Status: AC
  Filled 2017-03-24: qty 100

## 2017-03-24 MED ORDER — LACTATED RINGERS IV SOLN
INTRAVENOUS | Status: DC
  Administered 2017-03-24 (×2): via INTRAVENOUS
  Filled 2017-03-24: qty 1000

## 2017-03-24 MED ORDER — PROPOFOL 10 MG/ML IV BOLUS
INTRAVENOUS | Status: AC
  Filled 2017-03-24: qty 40

## 2017-03-24 MED ORDER — SODIUM CHLORIDE 0.9% FLUSH
9.0000 mL | INTRAVENOUS | Status: DC | PRN
  Filled 2017-03-24: qty 10

## 2017-03-24 MED ORDER — DIPHENHYDRAMINE HCL 12.5 MG/5ML PO ELIX
12.5000 mg | ORAL_SOLUTION | Freq: Four times a day (QID) | ORAL | Status: DC | PRN
  Filled 2017-03-24: qty 5

## 2017-03-24 MED ORDER — MEPERIDINE HCL 25 MG/ML IJ SOLN
6.2500 mg | INTRAMUSCULAR | Status: DC | PRN
  Filled 2017-03-24: qty 1

## 2017-03-24 MED ORDER — METOCLOPRAMIDE HCL 5 MG/ML IJ SOLN
10.0000 mg | Freq: Once | INTRAMUSCULAR | Status: DC | PRN
  Filled 2017-03-24: qty 2

## 2017-03-24 MED ORDER — ONDANSETRON HCL 4 MG/2ML IJ SOLN
INTRAMUSCULAR | Status: DC | PRN
  Administered 2017-03-24: 4 mg via INTRAVENOUS

## 2017-03-24 MED ORDER — CEFAZOLIN SODIUM-DEXTROSE 2-4 GM/100ML-% IV SOLN
2.0000 g | INTRAVENOUS | Status: DC
  Filled 2017-03-24: qty 100

## 2017-03-24 MED ORDER — SCOPOLAMINE 1 MG/3DAYS TD PT72
MEDICATED_PATCH | TRANSDERMAL | Status: DC | PRN
  Administered 2017-03-24: 1 via TRANSDERMAL

## 2017-03-24 MED ORDER — DIPHENHYDRAMINE HCL 50 MG/ML IJ SOLN
12.5000 mg | Freq: Four times a day (QID) | INTRAMUSCULAR | Status: DC | PRN
  Filled 2017-03-24: qty 0.25

## 2017-03-24 MED ORDER — ARTIFICIAL TEARS OPHTHALMIC OINT
TOPICAL_OINTMENT | OPHTHALMIC | Status: AC
  Filled 2017-03-24: qty 3.5

## 2017-03-24 SURGICAL SUPPLY — 42 items
ADH SKN CLS APL DERMABOND .7 (GAUZE/BANDAGES/DRESSINGS) ×1
CANISTER SUCT 3000ML PPV (MISCELLANEOUS) ×3 IMPLANT
DERMABOND ADVANCED (GAUZE/BANDAGES/DRESSINGS) ×2
DERMABOND ADVANCED .7 DNX12 (GAUZE/BANDAGES/DRESSINGS) IMPLANT
DRAPE CESAREAN BIRTH W POUCH (DRAPES) ×3 IMPLANT
DRAPE WARM FLUID 44X44 (DRAPE) IMPLANT
DRSG OPSITE POSTOP 4X10 (GAUZE/BANDAGES/DRESSINGS) ×3 IMPLANT
DURAPREP 26ML APPLICATOR (WOUND CARE) ×3 IMPLANT
GAUZE SPONGE 4X4 16PLY XRAY LF (GAUZE/BANDAGES/DRESSINGS) IMPLANT
GLOVE BIO SURGEON STRL SZ 6.5 (GLOVE) ×2 IMPLANT
GLOVE BIO SURGEON STRL SZ7 (GLOVE) ×3 IMPLANT
GLOVE BIO SURGEONS STRL SZ 6.5 (GLOVE) ×2
GLOVE BIOGEL PI IND STRL 6.5 (GLOVE) IMPLANT
GLOVE BIOGEL PI IND STRL 7.0 (GLOVE) ×3 IMPLANT
GLOVE BIOGEL PI IND STRL 7.5 (GLOVE) IMPLANT
GLOVE BIOGEL PI INDICATOR 6.5 (GLOVE) ×4
GLOVE BIOGEL PI INDICATOR 7.0 (GLOVE) ×6
GLOVE BIOGEL PI INDICATOR 7.5 (GLOVE) ×4
GOWN STRL REUS W/TWL LRG LVL3 (GOWN DISPOSABLE) ×8 IMPLANT
NEEDLE HYPO 22GX1.5 SAFETY (NEEDLE) IMPLANT
NS IRRIG 1000ML POUR BTL (IV SOLUTION) ×3 IMPLANT
PACK ABDOMINAL GYN (CUSTOM PROCEDURE TRAY) ×3 IMPLANT
PAD OB MATERNITY 4.3X12.25 (PERSONAL CARE ITEMS) ×3 IMPLANT
PENCIL SMOKE EVAC W/HOLSTER (ELECTROSURGICAL) ×3 IMPLANT
PROTECTOR NERVE ULNAR (MISCELLANEOUS) ×1 IMPLANT
SPONGE LAP 18X18 X RAY DECT (DISPOSABLE) ×6 IMPLANT
STAPLER VISISTAT 35W (STAPLE) IMPLANT
SUT CHROMIC 0 SH (SUTURE) IMPLANT
SUT CHROMIC 2 0 SH (SUTURE) IMPLANT
SUT CHROMIC 3 0 SH 27 (SUTURE) IMPLANT
SUT MNCRL AB 4-0 PS2 18 (SUTURE) IMPLANT
SUT MON AB 4-0 PS1 27 (SUTURE) ×2 IMPLANT
SUT PDS AB 0 CT 36 (SUTURE) ×3 IMPLANT
SUT VIC AB 0 CT1 18XCR BRD8 (SUTURE) ×3 IMPLANT
SUT VIC AB 0 CT1 27 (SUTURE) ×3
SUT VIC AB 0 CT1 27XBRD ANBCTR (SUTURE) ×1 IMPLANT
SUT VIC AB 0 CT1 8-18 (SUTURE) ×9
SUT VIC AB 2-0 CT1 (SUTURE) ×3 IMPLANT
SUT VICRYL 0 TIES 12 18 (SUTURE) ×3 IMPLANT
SYR 30ML LL (SYRINGE) ×3 IMPLANT
TOWEL OR 17X24 6PK STRL BLUE (TOWEL DISPOSABLE) ×6 IMPLANT
TRAY FOLEY CATH SILVER 14FR (SET/KITS/TRAYS/PACK) ×3 IMPLANT

## 2017-03-24 NOTE — Anesthesia Postprocedure Evaluation (Signed)
Anesthesia Post Note  Patient: Shelia Hamilton  Procedure(s) Performed: HYSTERECTOMY ABDOMINAL TOTAL (Bilateral Abdomen) BILATERAL SALPINGECTOMY (Bilateral Abdomen)     Patient location during evaluation: PACU Anesthesia Type: General Level of consciousness: awake and alert Pain management: pain level controlled Vital Signs Assessment: post-procedure vital signs reviewed and stable Respiratory status: spontaneous breathing, nonlabored ventilation, respiratory function stable and patient connected to nasal cannula oxygen Cardiovascular status: blood pressure returned to baseline and stable Postop Assessment: no apparent nausea or vomiting Anesthetic complications: no    Last Vitals:  Vitals:   03/24/17 0930 03/24/17 0945  BP: 128/72 124/79  Pulse: 86 62  Resp: 15 11  Temp:    SpO2: 100% 100%    Last Pain:  Vitals:   03/24/17 0903  TempSrc:   PainSc: Asleep                 Montez Hageman

## 2017-03-24 NOTE — Anesthesia Procedure Notes (Signed)
Procedure Name: Intubation Date/Time: 03/24/2017 7:25 AM Performed by: Montez Hageman, MD Pre-anesthesia Checklist: Patient identified, Emergency Drugs available, Suction available and Patient being monitored Patient Re-evaluated:Patient Re-evaluated prior to induction Oxygen Delivery Method: Circle system utilized Preoxygenation: Pre-oxygenation with 100% oxygen Induction Type: IV induction Ventilation: Mask ventilation without difficulty Laryngoscope Size: Mac and 3 Grade View: Grade I Tube type: Oral Tube size: 7.0 mm Number of attempts: 1 Airway Equipment and Method: Stylet and Oral airway Placement Confirmation: ETT inserted through vocal cords under direct vision,  positive ETCO2 and breath sounds checked- equal and bilateral Secured at: 21 (at teeth) cm Tube secured with: Tape Dental Injury: Teeth and Oropharynx as per pre-operative assessment

## 2017-03-24 NOTE — H&P (Signed)
AFF VSS ABD SOFT INC CREAR GOOD UO

## 2017-03-24 NOTE — Brief Op Note (Signed)
03/24/2017  8:56 AM  PATIENT:  Shelia Hamilton  47 y.o. female  PRE-OPERATIVE DIAGNOSIS:  fibroids  POST-OPERATIVE DIAGNOSIS:  fibroids  PROCEDURE:  Procedure(s): HYSTERECTOMY ABDOMINAL TOTAL (Bilateral) BILATERAL SALPINGECTOMY (Bilateral)  SURGEON:  Surgeon(s) and Role:    * Arvella Nigh, MD - Primary    * Royston Sinner, Colin Benton, MD - Assisting  PHYSICIAN ASSISTANT:   ASSISTANTS: leger   ANESTHESIA:   general  EBL:  200 mL   BLOOD ADMINISTERED:none  DRAINS: Urinary Catheter (Foley)   LOCAL MEDICATIONS USED:  NONE  SPECIMEN:  Source of Specimen:  uterus and both tubes  DISPOSITION OF SPECIMEN:  PATHOLOGY  COUNTS:  YES  TOURNIQUET:  * No tourniquets in log *  DICTATION: .Other Dictation: Dictation Number [id number]  PLAN OF CARE: Admit for overnight observation  PATIENT DISPOSITION:  PACU - hemodynamically stable.   Delay start of Pharmacological VTE agent (>24hrs) due to surgical blood loss or risk of bleeding: no

## 2017-03-24 NOTE — Anesthesia Preprocedure Evaluation (Signed)
Anesthesia Evaluation  Patient identified by MRN, date of birth, ID band Patient awake    Reviewed: Allergy & Precautions, NPO status , Patient's Chart, lab work & pertinent test results  Airway Mallampati: II  TM Distance: >3 FB Neck ROM: Full    Dental no notable dental hx.    Pulmonary neg pulmonary ROS,    Pulmonary exam normal breath sounds clear to auscultation       Cardiovascular negative cardio ROS Normal cardiovascular exam Rhythm:Regular Rate:Normal     Neuro/Psych negative neurological ROS  negative psych ROS   GI/Hepatic negative GI ROS, Neg liver ROS,   Endo/Other  negative endocrine ROS  Renal/GU negative Renal ROS  negative genitourinary   Musculoskeletal negative musculoskeletal ROS (+)   Abdominal   Peds negative pediatric ROS (+)  Hematology  (+) anemia ,   Anesthesia Other Findings   Reproductive/Obstetrics negative OB ROS                            Anesthesia Physical Anesthesia Plan  ASA: II  Anesthesia Plan: General   Post-op Pain Management:    Induction: Intravenous  PONV Risk Score and Plan: 3 and Ondansetron, Dexamethasone, Midazolam and Treatment may vary due to age or medical condition  Airway Management Planned: Oral ETT  Additional Equipment:   Intra-op Plan:   Post-operative Plan: Extubation in OR  Informed Consent: I have reviewed the patients History and Physical, chart, labs and discussed the procedure including the risks, benefits and alternatives for the proposed anesthesia with the patient or authorized representative who has indicated his/her understanding and acceptance.   Dental advisory given  Plan Discussed with: CRNA  Anesthesia Plan Comments:         Anesthesia Quick Evaluation

## 2017-03-24 NOTE — Progress Notes (Signed)
Pt tolerating fluids. At 2145, discussed plan of care with patient. Pt was in agreement that she wants to turn PCA off and transition to oral Percocet. Percocet given per PRN order (see MAR). PCA turned off at 2245. Continue to follow. Blair Hailey, RN

## 2017-03-24 NOTE — Transfer of Care (Signed)
Last Vitals:  Vitals:   03/24/17 0525 03/24/17 0903  BP: 110/65 (P) 119/69  Pulse: 71   Resp: 19 (P) 16  Temp: 37.1 C (P) 36.4 C  SpO2: 100% (P) 98%    Last Pain:  Vitals:   03/24/17 0525  TempSrc: Oral      Patients Stated Pain Goal: 4 (03/24/17 0540)  Immediate Anesthesia Transfer of Care Note  Patient: Shelia Hamilton  Procedure(s) Performed: Procedure(s) (LRB): HYSTERECTOMY ABDOMINAL TOTAL (Bilateral) BILATERAL SALPINGECTOMY (Bilateral)  Patient Location: PACU  Anesthesia Type: General  Level of Consciousness: awake, alert  and oriented  Airway & Oxygen Therapy: Patient Spontanous Breathing and Patient connected to nasal cannula oxygen  Post-op Assessment: Report given to PACU RN and Post -op Vital signs reviewed and stable  Post vital signs: Reviewed and stable  Complications: No apparent anesthesia complications

## 2017-03-24 NOTE — Op Note (Signed)
NAME:  Shelia Hamilton, Shelia Hamilton NO.:  [id number]  MEDICAL RECORD NO.:  [id number]  LOCATION:                                 FACILITY:  PHYSICIAN:  Darlyn Chamber, M.D.        DATE OF BIRTH:  DATE OF PROCEDURE:  03/24/2017 DATE OF DISCHARGE:                              OPERATIVE REPORT   PREOPERATIVE DIAGNOSIS:  Abnormal uterine bleeding secondary to uterine fibroids.  POSTOPERATIVE DIAGNOSIS:  Abnormal uterine bleeding secondary to uterine fibroids with addition of extensive pelvic adhesions.  OPERATIVE PROCEDURE:  Total abdominal hysterectomy with removal of both fallopian tubes.  SURGEON:  Darlyn Chamber, M.D.  ASSISTANT:  Lucillie Garfinkel, MD.  ANESTHESIA:  General endotracheal.  ESTIMATED BLOOD LOSS:  300 cc.  PACKS:  None.  DRAINS:  Included urethral Foley.  INTRAOPERATIVE BLOOD REPLACEMENT:  None.  COMPLICATIONS:  None.  Indications and risks were dictated in history and physical.  PROCEDURE IN DETAIL:  The patient was taken to the OR, placed in supine position.  After satisfactory level of general endotracheal anesthesia obtained.  The perineum and vagina were prepped out with Betadine. Foley was placed to straight drain.  Abdomen was prepped with DuraPrep. The patient was then draped in sterile field.  Prior low transverse incision was excised.  Incision was then extended through the subcutaneous tissue.  Fascia was entered sharply and incision in the fascia extended laterally.  Fascia taken off the muscle superiorly, inferiorly.  Next, the perineum was entered sharply.  Incision of perineum extended both superiorly and inferiorly.  Uterus was elevated. She did have adhesions from the omentum and small intestines to the top of the uterus, these were flimsy.  These were taken down along with adhesions to the left ovary.  First, Kelly clamps were placed on both utero-ovarian pedicles.  Uterus was then elevated, went to the right side first.  The  right fallopian tube was elevated using clamp, cut and tie technique.  It was serially separated up to the uterine fundus and excised.  Next, the right round ligament was clamped, cut and suture ligated with 0 Vicryl.  Utero-ovarian pedicle was developed, clamped, cut, and doubly ligated first with free tie of 0 Vicryl and suture ligature of 0-Vicryl.  The bladder flap was then developed.  The right uterine vessels were then clamped, cut, and suture ligated with 0 Vicryl.  We then went to the left side.  The left tube was removed using a clamp, cut and tie technique with suture ligature of 0 Vicryl.  It was excised.  The left round ligament was clamped, cut and suture ligated using 0 Vicryl.  The utero-ovarian pedicle was isolated, clamped, cut, and doubly ligated first with a free tie of 0 Vicryl, suture ligature of 0-Vicryl.  Bladder flap was further developed.  The left uterine vessels were clamped, cut, and suture ligated with 0 Vicryl.  Using the clamp, cut and tie technique with suture ligatures of 0 Vicryl, the parametrium was serially separated from sides of the uterus.  At this point in time, both vaginal angles were clamped and cut and then vaginal mucosa was then incised and the uterus  was passed off the operative field.  The cervix was intact.  Held pedicles secured with suture ligatures of 0 Vicryl.  The intervening vaginal mucosa was closed with two figure-of- eights of 0 Vicryl.  Urine output was clear and adequate.  We irrigated the pelvis, had some minimal oozing from the left side of the vaginal cuff, this was brought under control with Bovie.  Both ovaries were suture ligated to the round ligaments.  Total amount of pelvis.  We had good hemostasis.  Packs that were put in place were removed.  Perineum was closed with running suture of 0 Vicryl.  Fascia was closed with running suture of 0 PDS.  The subcutaneous tissue was closed with interrupted figure-of-eights of 2-0  plain catgut and skin was closed with running subcuticular 4-0 Monocryl and Dermabond.  Sponge, instrument and needle counts were reported as correct by circulating nurse x2.  Foley catheter remained clear at the time of closure.  The patient tolerated the procedure well and was returned to the recovery room in good condition.     Darlyn Chamber, M.D.     JSM/MEDQ  D:  03/24/2017  T:  03/24/2017  Job:  [id number]

## 2017-03-24 NOTE — H&P (Signed)
  History and physical exam unchanged 

## 2017-03-25 ENCOUNTER — Encounter (HOSPITAL_BASED_OUTPATIENT_CLINIC_OR_DEPARTMENT_OTHER): Payer: Self-pay | Admitting: Obstetrics and Gynecology

## 2017-03-25 DIAGNOSIS — N879 Dysplasia of cervix uteri, unspecified: Secondary | ICD-10-CM | POA: Diagnosis not present

## 2017-03-25 LAB — CBC
HCT: 24.4 % — ABNORMAL LOW (ref 36.0–46.0)
Hemoglobin: 8 g/dL — ABNORMAL LOW (ref 12.0–15.0)
MCH: 25.2 pg — AB (ref 26.0–34.0)
MCHC: 32.8 g/dL (ref 30.0–36.0)
MCV: 76.7 fL — ABNORMAL LOW (ref 78.0–100.0)
PLATELETS: 272 10*3/uL (ref 150–400)
RBC: 3.18 MIL/uL — AB (ref 3.87–5.11)
RDW: 15.8 % — AB (ref 11.5–15.5)
WBC: 17.1 10*3/uL — ABNORMAL HIGH (ref 4.0–10.5)

## 2017-03-25 LAB — HCG, SERUM, QUALITATIVE: PREG SERUM: NEGATIVE

## 2017-03-25 MED ORDER — OXYCODONE-ACETAMINOPHEN 7.5-325 MG PO TABS: 1.0000 | ORAL_TABLET | ORAL | 0 refills | Status: DC | PRN

## 2017-03-25 MED ORDER — IBUPROFEN 200 MG PO TABS
ORAL_TABLET | ORAL | Status: AC
  Filled 2017-03-25: qty 4

## 2017-03-25 MED ORDER — OXYCODONE-ACETAMINOPHEN 5-325 MG PO TABS
ORAL_TABLET | ORAL | Status: AC
  Filled 2017-03-25: qty 1

## 2017-03-25 NOTE — Progress Notes (Signed)
Wasted 19 mg/ml unused PCA hydromorphone in sink, witnessed by Doree Fudge, RN.  Blair Hailey, RN

## 2017-03-25 NOTE — Discharge Summary (Signed)
Patient name Shelia Hamilton, Shelia Hamilton DICTATION# [id number] CSN# [id number]  [facility], MD 03/25/2017 8:48 AM

## 2017-03-25 NOTE — Progress Notes (Signed)
1 Day Post-Op Procedure(s) (LRB): HYSTERECTOMY ABDOMINAL TOTAL (Bilateral) BILATERAL SALPINGECTOMY (Bilateral)  Subjective: Patient reports tolerating PO and no problems voiding.    Objective: I have reviewed patient's vital signs, intake and output and labs.  General: alert GI: soft, non-tender; bowel sounds normal; no masses,  no organomegaly Vaginal Bleeding: minimal  Assessment: s/p Procedure(s): HYSTERECTOMY ABDOMINAL TOTAL (Bilateral) BILATERAL SALPINGECTOMY (Bilateral): stable  Plan: Discharge home  LOS: 0 days    Shelia Hamilton S 03/25/2017, 8:47 AM

## 2017-03-26 NOTE — Discharge Summary (Signed)
NAMEMarland Hamilton  NETTYE, FLEGAL NO.:  [id number]  MEDICAL RECORD NO.:  [id number]  LOCATION:                                 FACILITY:  PHYSICIAN:  Darlyn Chamber, M.D.        DATE OF BIRTH:  DATE OF ADMISSION:  03/24/2017 DATE OF DISCHARGE:  03/25/2017                              DISCHARGE SUMMARY   ADMITTING DIAGNOSIS:  Symptomatic uterine fibroids.  DISCHARGE DIAGNOSIS:  Symptomatic uterine fibroids.  OPERATIVE PROCEDURE:  Total abdominal hysterectomy with removal of both fallopian tubes.  For complete history and physical, please see dictated note.  COURSE IN THE HOSPITAL:  The patient underwent above-noted surgery.  Did find pelvic adhesions in addition to the fibroids.  Surgery went well. Postop, her hemoglobin was 8.  She was discharged home on the first postop day.  At that time, she was afebrile with stable vital signs. Abdomen was soft.  Bowel sounds were active.  Incision was clear.  She is voiding without difficulty, tolerating a regular diet.  In terms of complication, none were countered during the stay in the hospital.  The patient is discharged home in stable condition.  DISPOSITION:  The patient is to avoid heavy lifting, vaginal entrance, or driving a car.  She should call should there be any fever, nausea, vomiting, excessive bleeding, excessive pain.  Also instructed on signs and symptoms of deep venous thrombosis and pulmonary embolus. Discharged home on Percocet as needed for pain.  Plan to follow up in office in 1 week.     Darlyn Chamber, M.D.     JSM/MEDQ  D:  03/25/2017  T:  03/25/2017  Job:  [id number]

## 2017-11-14 NOTE — H&P (Signed)
NAME: Shelia Hamilton, Shelia Hamilton MEDICAL RECORD [id number] ACCOUNT [id number] DATE OF BIRTH:08-16-1970 FACILITY: [facility] LOCATION:  PHYSICIAN:Bahar Shelden Sherran Needs, MD  HISTORY AND PHYSICAL  DATE OF ADMISSION:  12/01/2017  For surgery on 12/01/2017 to be done at [facility] on [facility].  HISTORY OF PRESENT ILLNESS:  The patient is a 47 year old gravida 1, para 1 female who presents for laparoscopic removal of right ovary and lysis of adhesions.  The patient did have a total abdominal hysterectomy with removal of both fallopian tubes in March of this year for management of symptomatic uterine fibroids.  She was noted to have some pelvic adhesions at that point in time.  She has continued to  experience pain with intercourse, particularly on the right side.  It has become progressive and limiting.  We have evaluated with ultrasound that revealed a small follicular cyst of the right ovary.  We went over various options including conservative  management versus laparoscopy with lysis of adhesions versus laparoscopic removal of one or both ovaries.  The patient wants to proceed with laparoscopy and removal of right tube and ovary for which she comes in at the present time.  ALLERGIES:  She has no known drug allergies.  MEDICATIONS:  None listed.  PAST MEDICAL HISTORY:  She has usual childhood diseases without significant sequelae.  PAST SURGICAL HISTORY:  As noted above.  She had the total abdominal hysterectomy with removal of both fallopian tubes.  She has had a cesarean section and a partial thyroidectomy.  SOCIAL HISTORY:  Reveals no tobacco or alcohol use.  FAMILY HISTORY:  Noncontributory.  REVIEW OF SYSTEMS:  Noncontributory.  PHYSICAL EXAMINATION: VITAL SIGNS:  Afebrile, stable vital signs. HEENT:  The patient is normocephalic.  Pupils equal, round, reactive to light and accommodation.  Extraocular movements are intact.  Sclerae and conjunctivae to clear.  Oropharynx  clear. NECK:  Without thyromegaly. BREASTS:  No discrete masses. LUNGS:  Clear. HEART:  Regular rhythm and rate without murmurs or gallops. ABDOMEN:  Benign.  Well-healed low transverse incision. PELVIC:  Normal external genitalia.  Vaginal mucosa is clear.  Cuff intact.  Does have right-sided tenderness.  No masses noted. EXTREMITIES:  Trace edema. NEUROLOGIC:  Grossly normal limits.  IMPRESSION:  Deep dyspareunia possibly secondary to pelvic adhesions.  PLAN:  The patient to undergo diagnostic laparoscopy, lysis of adhesions, removal of right ovary.  Risk of surgery have been discussed including the risk of infection.  Risk of hemorrhage that could require transfusion with the risk of AIDS or hepatitis.   Risk of injury to adjacent organs including bladder, bowel, ureters that could require further exploratory surgery.  Risk of deep venous thrombosis and pulmonary embolus.  We did explain the possibility of exploratory surgery, if we cannot remove this  laparoscopically.  We will do cystoscopy at the end of the procedure to document no ureteric injury.  The patient expressed understanding of indications and risk.  TN/NUANCE  D:11/14/2017 T:11/14/2017 JOB:[id number]

## 2017-11-14 NOTE — H&P (Signed)
Patient name  Shelia Hamilton, Shelia Hamilton DICTATION#  [id number] CSN# [id number]  Arvella Nigh, MD 11/14/2017 8:24 AM

## 2017-11-19 ENCOUNTER — Other Ambulatory Visit: Payer: Self-pay | Admitting: Obstetrics and Gynecology

## 2017-11-19 DIAGNOSIS — Z803 Family history of malignant neoplasm of breast: Secondary | ICD-10-CM

## 2017-11-25 IMAGING — MG MM CLIP PLACEMENT
2 series · 2 of 2 positions shown · non-contrast
Comparison: Previous exam(s).

CLINICAL DATA: Patient is post ultrasound-guided core needle biopsy
right breast mass.

EXAM:
DIAGNOSTIC RIGHT MAMMOGRAM POST ULTRASOUND BIOPSY

[R ML]
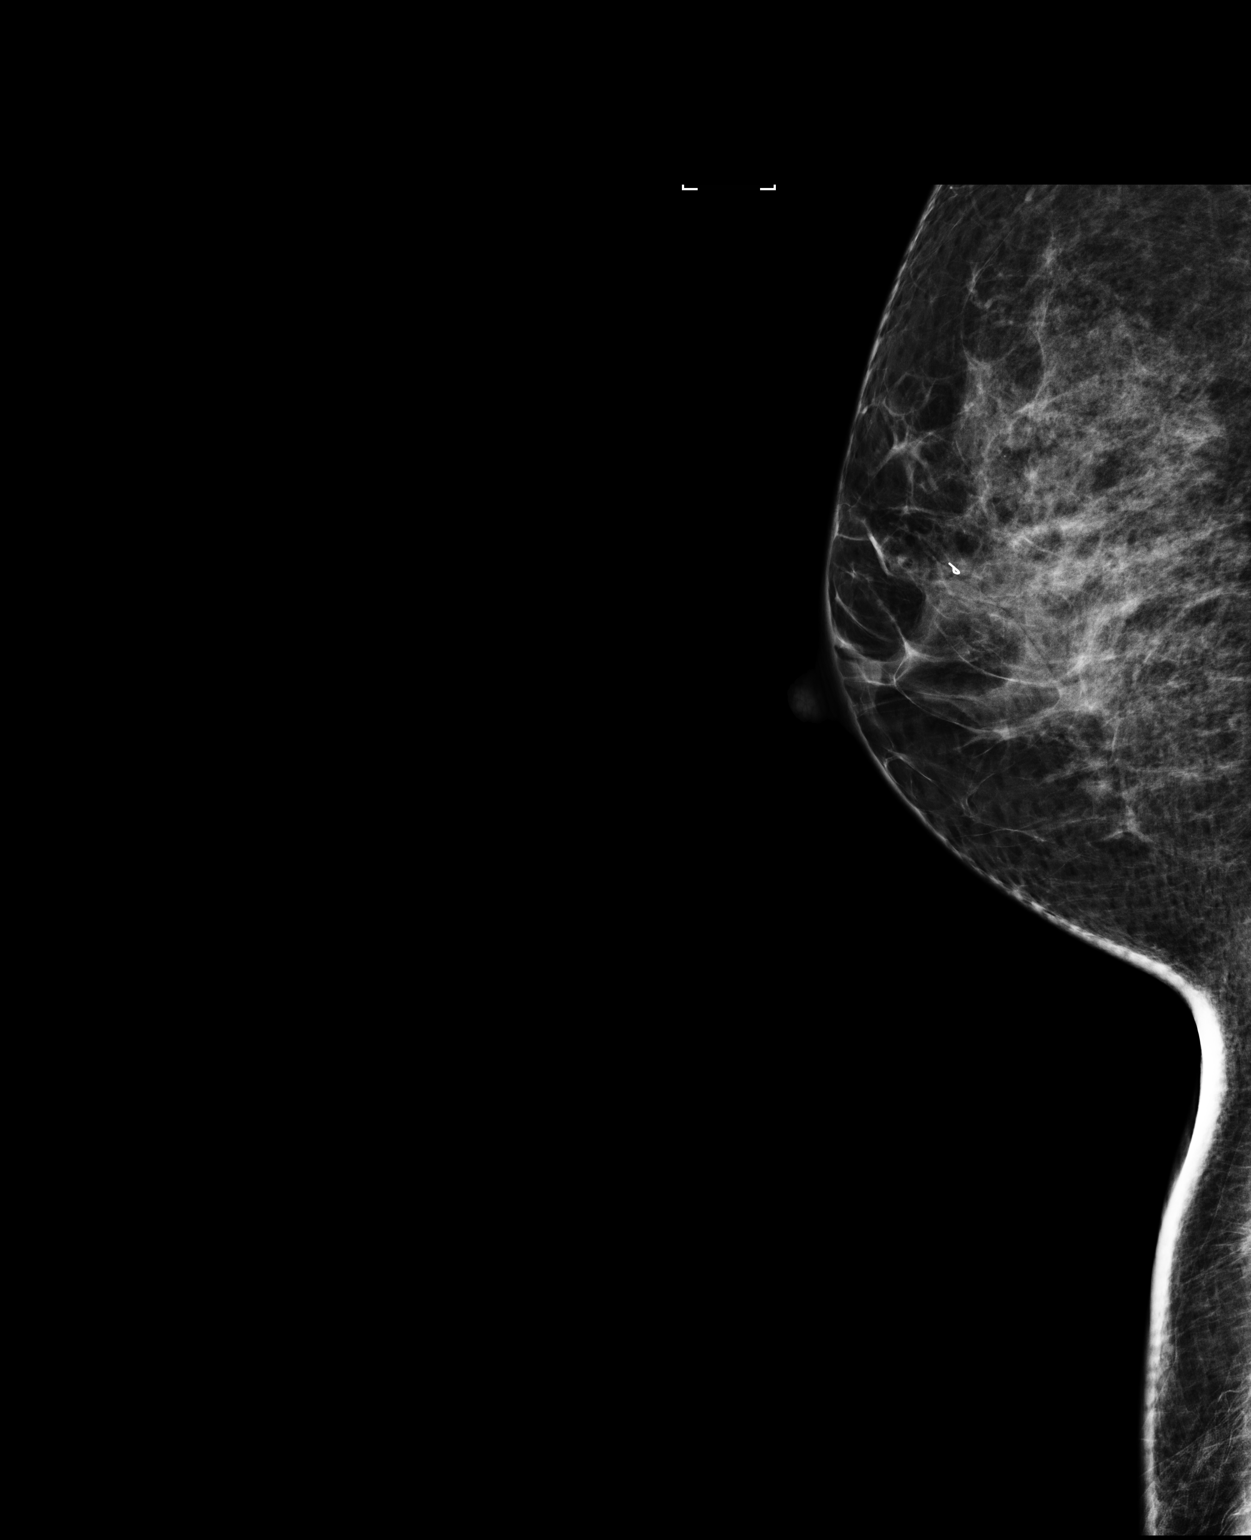

[R CC]
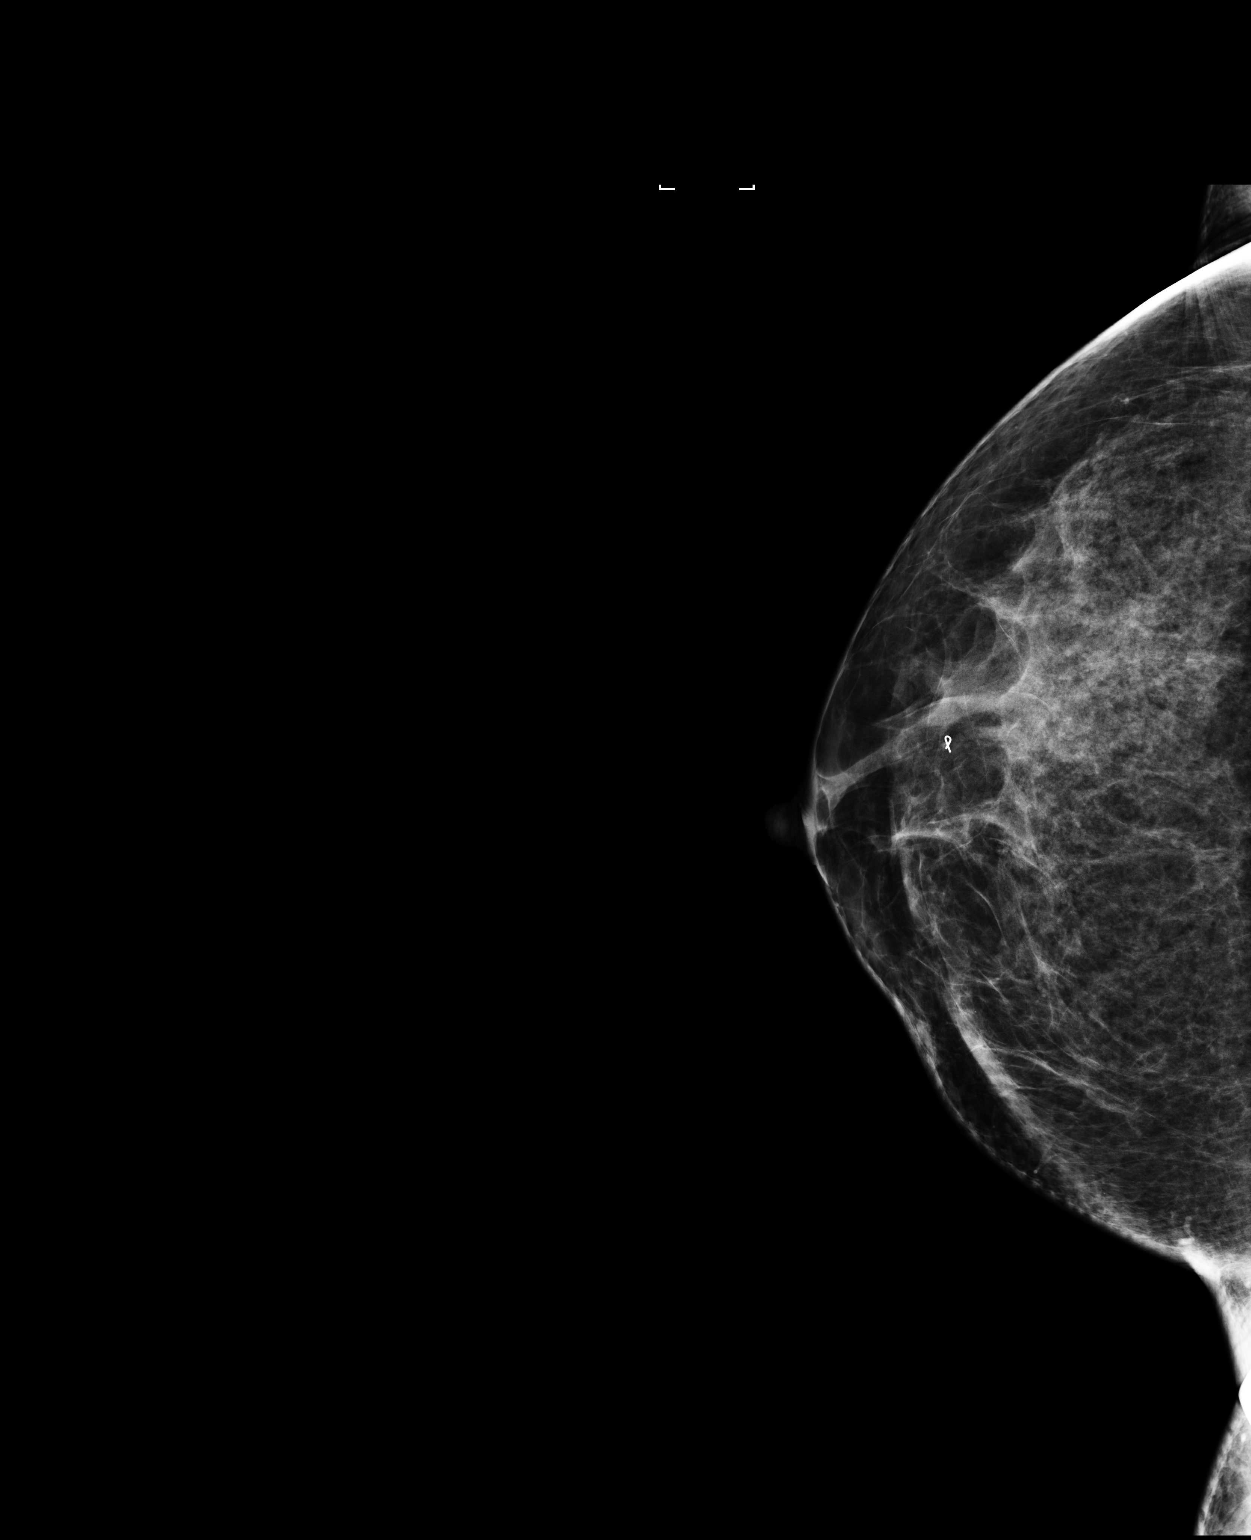

[2 of 2 positions shown; findings below may reference images not displayed]

FINDINGS: Mammographic images were obtained following ultrasound guided biopsy
of the targeted mass at the 11 o'clock position of the right breast
2 cm from the nipple. Images demonstrates satisfactory placement of
a ribbon shaped metallic clip over the biopsied mass.
IMPRESSION: Satisfactory clip placement post ultrasound-guided core needle
biopsy right breast mass.

Final Assessment: Post Procedure Mammograms for Marker Placement

## 2017-11-26 ENCOUNTER — Encounter (HOSPITAL_BASED_OUTPATIENT_CLINIC_OR_DEPARTMENT_OTHER): Payer: Self-pay | Admitting: *Deleted

## 2017-11-26 ENCOUNTER — Other Ambulatory Visit: Payer: Self-pay

## 2017-11-26 NOTE — Progress Notes (Signed)
Spoke w/ pt via phone for pre-op interview.  Npo after mn.  Arrive at 0530.  Needs cbc and t&s.

## 2017-11-30 ENCOUNTER — Ambulatory Visit
Admission: RE | Admit: 2017-11-30 | Discharge: 2017-11-30 | Disposition: A | Discharge status: Home / Self Care | Payer: BC Managed Care – PPO | Source: Ambulatory Visit | Attending: Obstetrics and Gynecology | Admitting: Obstetrics and Gynecology

## 2017-11-30 DIAGNOSIS — Z803 Family history of malignant neoplasm of breast: Secondary | ICD-10-CM

## 2017-11-30 MED ORDER — GADOBUTROL 1 MMOL/ML IV SOLN
10.0000 mL | Freq: Once | INTRAVENOUS | Status: AC | PRN
  Administered 2017-11-30: 10 mL via INTRAVENOUS

## 2017-11-30 NOTE — Anesthesia Preprocedure Evaluation (Addendum)
Anesthesia Evaluation  Patient identified by MRN, date of birth, ID band Patient awake    Reviewed: Allergy & Precautions, NPO status , Patient's Chart, lab work & pertinent test results  History of Anesthesia Complications Negative for: history of anesthetic complications  Airway Mallampati: I  TM Distance: >3 FB Neck ROM: Full    Dental  (+) Dental Advisory Given, Teeth Intact   Pulmonary neg pulmonary ROS,    breath sounds clear to auscultation       Cardiovascular negative cardio ROS   Rhythm:Regular Rate:Normal     Neuro/Psych negative neurological ROS  negative psych ROS   GI/Hepatic negative GI ROS, Neg liver ROS,   Endo/Other   Obesity   Renal/GU negative Renal ROS     Musculoskeletal negative musculoskeletal ROS (+)   Abdominal   Peds  Hematology negative hematology ROS (+)   Anesthesia Other Findings   Reproductive/Obstetrics  Ovarian cyst DUB S/p abdominal hysterectomy, b/l salpingectomy                             Anesthesia Physical Anesthesia Plan  ASA: II  Anesthesia Plan: General   Post-op Pain Management:    Induction: Intravenous  PONV Risk Score and Plan: 4 or greater and Treatment may vary due to age or medical condition, Ondansetron, Dexamethasone, Midazolam and Scopolamine patch - Pre-op  Airway Management Planned: Oral ETT  Additional Equipment: None  Intra-op Plan:   Post-operative Plan: Extubation in OR  Informed Consent: I have reviewed the patients History and Physical, chart, labs and discussed the procedure including the risks, benefits and alternatives for the proposed anesthesia with the patient or authorized representative who has indicated his/her understanding and acceptance.   Dental advisory given  Plan Discussed with: CRNA and Anesthesiologist  Anesthesia Plan Comments:       Anesthesia Quick Evaluation

## 2017-12-01 ENCOUNTER — Ambulatory Visit (HOSPITAL_BASED_OUTPATIENT_CLINIC_OR_DEPARTMENT_OTHER): Payer: BC Managed Care – PPO | Admitting: Anesthesiology

## 2017-12-01 ENCOUNTER — Encounter (HOSPITAL_BASED_OUTPATIENT_CLINIC_OR_DEPARTMENT_OTHER): Payer: Self-pay

## 2017-12-01 ENCOUNTER — Encounter (HOSPITAL_COMMUNITY)
Admission: RE | Disposition: A | Discharge status: Home / Self Care | Payer: Self-pay | Source: Ambulatory Visit | Attending: Obstetrics and Gynecology

## 2017-12-01 ENCOUNTER — Inpatient Hospital Stay (HOSPITAL_BASED_OUTPATIENT_CLINIC_OR_DEPARTMENT_OTHER)
Admission: RE | Admit: 2017-12-01 | Discharge: 2017-12-03 | DRG: 743 | Disposition: A | Discharge status: Home / Self Care | Payer: BC Managed Care – PPO | Source: Ambulatory Visit | Attending: Obstetrics and Gynecology | Admitting: Obstetrics and Gynecology

## 2017-12-01 DIAGNOSIS — Z90722 Acquired absence of ovaries, bilateral: Secondary | ICD-10-CM

## 2017-12-01 DIAGNOSIS — N8301 Follicular cyst of right ovary: Principal | ICD-10-CM | POA: Diagnosis present

## 2017-12-01 DIAGNOSIS — Z90710 Acquired absence of both cervix and uterus: Secondary | ICD-10-CM

## 2017-12-01 DIAGNOSIS — N736 Female pelvic peritoneal adhesions (postinfective): Secondary | ICD-10-CM | POA: Diagnosis present

## 2017-12-01 DIAGNOSIS — N9412 Deep dyspareunia: Secondary | ICD-10-CM | POA: Diagnosis present

## 2017-12-01 HISTORY — DX: Female pelvic peritoneal adhesions (postinfective): N73.6

## 2017-12-01 HISTORY — DX: Unspecified dyspareunia: N94.10

## 2017-12-01 HISTORY — DX: Unspecified ovarian cyst, right side: N83.201

## 2017-12-01 LAB — CBC
HCT: 36.6 % (ref 36.0–46.0)
Hemoglobin: 11.8 g/dL — ABNORMAL LOW (ref 12.0–15.0)
MCH: 27.3 pg (ref 26.0–34.0)
MCHC: 32.2 g/dL (ref 30.0–36.0)
MCV: 84.5 fL (ref 80.0–100.0)
PLATELETS: 255 10*3/uL (ref 150–400)
RBC: 4.33 MIL/uL (ref 3.87–5.11)
RDW: 13.4 % (ref 11.5–15.5)
WBC: 4.7 10*3/uL (ref 4.0–10.5)
nRBC: 0 % (ref 0.0–0.2)

## 2017-12-01 LAB — TYPE AND SCREEN
ABO/RH(D): O POS
Antibody Screen: NEGATIVE

## 2017-12-01 SURGERY — OOPHORECTOMY, LAPAROSCOPIC
Anesthesia: General | Site: Abdomen | Laterality: Right

## 2017-12-01 MED ORDER — PHENYLEPHRINE 40 MCG/ML (10ML) SYRINGE FOR IV PUSH (FOR BLOOD PRESSURE SUPPORT)
PREFILLED_SYRINGE | INTRAVENOUS | Status: AC
  Filled 2017-12-01: qty 10

## 2017-12-01 MED ORDER — DIPHENHYDRAMINE HCL 50 MG/ML IJ SOLN
12.5000 mg | Freq: Four times a day (QID) | INTRAMUSCULAR | Status: DC | PRN
  Filled 2017-12-01: qty 0.25

## 2017-12-01 MED ORDER — PROPOFOL 10 MG/ML IV BOLUS
INTRAVENOUS | Status: DC | PRN
  Administered 2017-12-01: 200 mg via INTRAVENOUS

## 2017-12-01 MED ORDER — DEXAMETHASONE SODIUM PHOSPHATE 10 MG/ML IJ SOLN
INTRAMUSCULAR | Status: DC | PRN
  Administered 2017-12-01: 10 mg via INTRAVENOUS

## 2017-12-01 MED ORDER — CEFAZOLIN SODIUM-DEXTROSE 2-4 GM/100ML-% IV SOLN
2.0000 g | INTRAVENOUS | Status: AC
  Administered 2017-12-01: 2 g via INTRAVENOUS
  Filled 2017-12-01: qty 100

## 2017-12-01 MED ORDER — ACETAMINOPHEN 325 MG PO TABS
650.0000 mg | ORAL_TABLET | ORAL | Status: DC | PRN
  Filled 2017-12-01: qty 2

## 2017-12-01 MED ORDER — MIDAZOLAM HCL 2 MG/2ML IJ SOLN
INTRAMUSCULAR | Status: AC
  Filled 2017-12-01: qty 2

## 2017-12-01 MED ORDER — ONDANSETRON HCL 4 MG PO TABS
4.0000 mg | ORAL_TABLET | Freq: Four times a day (QID) | ORAL | Status: DC | PRN
  Filled 2017-12-01: qty 1

## 2017-12-01 MED ORDER — LACTATED RINGERS IV SOLN
INTRAVENOUS | Status: DC
  Administered 2017-12-01: 1000 mL via INTRAVENOUS
  Filled 2017-12-01: qty 1000

## 2017-12-01 MED ORDER — MENTHOL 3 MG MT LOZG
1.0000 | LOZENGE | OROMUCOSAL | Status: DC | PRN
  Filled 2017-12-01: qty 9

## 2017-12-01 MED ORDER — LIDOCAINE 2% (20 MG/ML) 5 ML SYRINGE
INTRAMUSCULAR | Status: DC | PRN
  Administered 2017-12-01: 100 mg via INTRAVENOUS

## 2017-12-01 MED ORDER — HYDROMORPHONE HCL 1 MG/ML IJ SOLN
1.0000 mg | INTRAMUSCULAR | Status: DC | PRN
  Administered 2017-12-01 – 2017-12-02 (×2): 1 mg via INTRAVENOUS
  Filled 2017-12-01 (×2): qty 1

## 2017-12-01 MED ORDER — ROCURONIUM BROMIDE 10 MG/ML (PF) SYRINGE
PREFILLED_SYRINGE | INTRAVENOUS | Status: DC | PRN
  Administered 2017-12-01: 50 mg via INTRAVENOUS

## 2017-12-01 MED ORDER — FENTANYL CITRATE (PF) 100 MCG/2ML IJ SOLN
INTRAMUSCULAR | Status: DC | PRN
  Administered 2017-12-01: 50 ug via INTRAVENOUS
  Administered 2017-12-01: 100 ug via INTRAVENOUS

## 2017-12-01 MED ORDER — OXYCODONE-ACETAMINOPHEN 5-325 MG PO TABS
1.0000 | ORAL_TABLET | ORAL | Status: DC | PRN
  Administered 2017-12-01 – 2017-12-03 (×6): 1 via ORAL
  Filled 2017-12-01 (×7): qty 1

## 2017-12-01 MED ORDER — SUGAMMADEX SODIUM 200 MG/2ML IV SOLN
INTRAVENOUS | Status: DC | PRN
  Administered 2017-12-01: 375 mg via INTRAVENOUS

## 2017-12-01 MED ORDER — DIPHENHYDRAMINE HCL 12.5 MG/5ML PO ELIX
12.5000 mg | ORAL_SOLUTION | Freq: Four times a day (QID) | ORAL | Status: DC | PRN
  Filled 2017-12-01: qty 5

## 2017-12-01 MED ORDER — FENTANYL CITRATE (PF) 100 MCG/2ML IJ SOLN
25.0000 ug | INTRAMUSCULAR | Status: DC | PRN
  Administered 2017-12-01: 50 ug via INTRAVENOUS
  Administered 2017-12-01: 25 ug via INTRAVENOUS
  Administered 2017-12-01: 50 ug via INTRAVENOUS
  Administered 2017-12-01: 25 ug via INTRAVENOUS
  Filled 2017-12-01: qty 1
  Filled 2017-12-01: qty 2

## 2017-12-01 MED ORDER — SCOPOLAMINE 1 MG/3DAYS TD PT72
MEDICATED_PATCH | TRANSDERMAL | Status: AC
  Filled 2017-12-01: qty 1

## 2017-12-01 MED ORDER — ONDANSETRON HCL 4 MG/2ML IJ SOLN
4.0000 mg | Freq: Four times a day (QID) | INTRAMUSCULAR | Status: DC | PRN
  Filled 2017-12-01: qty 2

## 2017-12-01 MED ORDER — SUGAMMADEX SODIUM 200 MG/2ML IV SOLN
INTRAVENOUS | Status: AC
  Filled 2017-12-01: qty 2

## 2017-12-01 MED ORDER — SCOPOLAMINE 1 MG/3DAYS TD PT72
MEDICATED_PATCH | TRANSDERMAL | Status: DC | PRN
  Administered 2017-12-01: 1 via TRANSDERMAL

## 2017-12-01 MED ORDER — ONDANSETRON HCL 4 MG/2ML IJ SOLN
INTRAMUSCULAR | Status: AC
  Filled 2017-12-01: qty 2

## 2017-12-01 MED ORDER — PROPOFOL 10 MG/ML IV BOLUS
INTRAVENOUS | Status: AC
  Filled 2017-12-01: qty 40

## 2017-12-01 MED ORDER — FENTANYL CITRATE (PF) 100 MCG/2ML IJ SOLN
INTRAMUSCULAR | Status: AC
  Filled 2017-12-01: qty 4

## 2017-12-01 MED ORDER — DEXAMETHASONE SODIUM PHOSPHATE 10 MG/ML IJ SOLN
INTRAMUSCULAR | Status: AC
  Filled 2017-12-01: qty 1

## 2017-12-01 MED ORDER — CEFAZOLIN SODIUM-DEXTROSE 2-4 GM/100ML-% IV SOLN
INTRAVENOUS | Status: AC
  Filled 2017-12-01: qty 100

## 2017-12-01 MED ORDER — KETOROLAC TROMETHAMINE 30 MG/ML IJ SOLN
INTRAMUSCULAR | Status: DC | PRN
  Administered 2017-12-01: 30 mg via INTRAVENOUS

## 2017-12-01 MED ORDER — ONDANSETRON HCL 4 MG/2ML IJ SOLN
INTRAMUSCULAR | Status: DC | PRN
  Administered 2017-12-01: 4 mg via INTRAVENOUS

## 2017-12-01 MED ORDER — PROMETHAZINE HCL 25 MG/ML IJ SOLN
6.2500 mg | INTRAMUSCULAR | Status: DC | PRN
  Filled 2017-12-01: qty 1

## 2017-12-01 MED ORDER — ACETAMINOPHEN 10 MG/ML IV SOLN
INTRAVENOUS | Status: AC
  Filled 2017-12-01: qty 100

## 2017-12-01 MED ORDER — ARTIFICIAL TEARS OPHTHALMIC OINT
TOPICAL_OINTMENT | OPHTHALMIC | Status: AC
  Filled 2017-12-01: qty 3.5

## 2017-12-01 MED ORDER — FENTANYL CITRATE (PF) 100 MCG/2ML IJ SOLN
INTRAMUSCULAR | Status: AC
  Filled 2017-12-01: qty 2

## 2017-12-01 MED ORDER — LACTATED RINGERS IV SOLN
INTRAVENOUS | Status: DC
  Administered 2017-12-01 (×2): via INTRAVENOUS
  Filled 2017-12-01: qty 1000

## 2017-12-01 MED ORDER — OXYCODONE HCL 5 MG/5ML PO SOLN
5.0000 mg | Freq: Once | ORAL | Status: DC | PRN
  Filled 2017-12-01: qty 5

## 2017-12-01 MED ORDER — ROCURONIUM BROMIDE 10 MG/ML (PF) SYRINGE
PREFILLED_SYRINGE | INTRAVENOUS | Status: AC
  Filled 2017-12-01: qty 10

## 2017-12-01 MED ORDER — LIDOCAINE 2% (20 MG/ML) 5 ML SYRINGE
INTRAMUSCULAR | Status: AC
  Filled 2017-12-01: qty 5

## 2017-12-01 MED ORDER — BUPIVACAINE HCL 0.25 % IJ SOLN
INTRAMUSCULAR | Status: DC | PRN
  Administered 2017-12-01: 8 mL

## 2017-12-01 MED ORDER — HYDROMORPHONE 1 MG/ML IV SOLN
INTRAVENOUS | Status: DC
  Filled 2017-12-01 (×2): qty 25

## 2017-12-01 MED ORDER — ACETAMINOPHEN 10 MG/ML IV SOLN
INTRAVENOUS | Status: DC | PRN
  Administered 2017-12-01: 1000 mg via INTRAVENOUS

## 2017-12-01 MED ORDER — SODIUM CHLORIDE 0.9 % IR SOLN
Status: DC | PRN
  Administered 2017-12-01: 1000 mL

## 2017-12-01 MED ORDER — KETOROLAC TROMETHAMINE 30 MG/ML IJ SOLN
INTRAMUSCULAR | Status: AC
  Filled 2017-12-01: qty 1

## 2017-12-01 MED ORDER — NALOXONE HCL 0.4 MG/ML IJ SOLN
0.4000 mg | INTRAMUSCULAR | Status: DC | PRN
  Filled 2017-12-01: qty 1

## 2017-12-01 MED ORDER — MIDAZOLAM HCL 2 MG/2ML IJ SOLN
INTRAMUSCULAR | Status: DC | PRN
  Administered 2017-12-01: 2 mg via INTRAVENOUS

## 2017-12-01 MED ORDER — OXYCODONE HCL 5 MG PO TABS
5.0000 mg | ORAL_TABLET | Freq: Once | ORAL | Status: DC | PRN
  Filled 2017-12-01: qty 1

## 2017-12-01 MED ORDER — SODIUM CHLORIDE 0.9% FLUSH
9.0000 mL | INTRAVENOUS | Status: DC | PRN
  Filled 2017-12-01: qty 10

## 2017-12-01 SURGICAL SUPPLY — 62 items
ADH SKN CLS APL DERMABOND .7 (GAUZE/BANDAGES/DRESSINGS) ×1
APPLICATOR COTTON TIP 6IN STRL (MISCELLANEOUS) IMPLANT
BAG RETRIEVAL 10MM (BASKET)
BLADE SURG 10 STRL SS (BLADE) ×2 IMPLANT
CABLE HIGH FREQUENCY MONO STRZ (ELECTRODE) ×2 IMPLANT
CANISTER SUCT 3000ML PPV (MISCELLANEOUS) ×2 IMPLANT
CANISTER SUCTION 1200CC (MISCELLANEOUS) IMPLANT
CATH ROBINSON RED A/P 16FR (CATHETERS) ×3 IMPLANT
CLOSURE WOUND 1/2 X4 (GAUZE/BANDAGES/DRESSINGS) ×1
COVER MAYO STAND STRL (DRAPES) ×3 IMPLANT
COVER WAND RF STERILE (DRAPES) ×3 IMPLANT
DERMABOND ADVANCED (GAUZE/BANDAGES/DRESSINGS) ×2
DERMABOND ADVANCED .7 DNX12 (GAUZE/BANDAGES/DRESSINGS) ×1 IMPLANT
DRSG COVADERM PLUS 2X2 (GAUZE/BANDAGES/DRESSINGS) IMPLANT
DRSG OPSITE POSTOP 4X10 (GAUZE/BANDAGES/DRESSINGS) ×2 IMPLANT
DURAPREP 26ML APPLICATOR (WOUND CARE) ×3 IMPLANT
GAUZE 4X4 16PLY RFD (DISPOSABLE) ×3 IMPLANT
GLOVE BIO SURGEON STRL SZ7 (GLOVE) ×10 IMPLANT
GLOVE BIO SURGEON STRL SZ7.5 (GLOVE) ×4 IMPLANT
GLOVE BIOGEL PI IND STRL 7.0 (GLOVE) IMPLANT
GLOVE BIOGEL PI IND STRL 7.5 (GLOVE) IMPLANT
GLOVE BIOGEL PI INDICATOR 7.0 (GLOVE) ×2
GLOVE BIOGEL PI INDICATOR 7.5 (GLOVE) ×2
GOWN STRL REUS W/TWL LRG LVL3 (GOWN DISPOSABLE) ×8 IMPLANT
GOWN STRL REUS W/TWL XL LVL3 (GOWN DISPOSABLE) ×3 IMPLANT
KIT TURNOVER CYSTO (KITS) ×3 IMPLANT
NEEDLE INSUFFLATION 120MM (ENDOMECHANICALS) IMPLANT
NS IRRIG 500ML POUR BTL (IV SOLUTION) ×5 IMPLANT
PACK LAPAROSCOPY BASIN (CUSTOM PROCEDURE TRAY) ×3 IMPLANT
PAD OB MATERNITY 4.3X12.25 (PERSONAL CARE ITEMS) ×3 IMPLANT
PAD PREP 24X48 CUFFED NSTRL (MISCELLANEOUS) ×3 IMPLANT
PENCIL BUTTON HOLSTER BLD 10FT (ELECTRODE) ×2 IMPLANT
SCISSORS LAP 5X45 EPIX DISP (ENDOMECHANICALS) IMPLANT
SEALER TISSUE G2 CVD JAW 45CM (ENDOMECHANICALS) IMPLANT
SET BERKELEY SUCTION TUBING (SUCTIONS) ×2 IMPLANT
SET IRRIG TUBING LAPAROSCOPIC (IRRIGATION / IRRIGATOR) ×2 IMPLANT
SET IRRIG Y TYPE TUR BLADDER L (SET/KITS/TRAYS/PACK) IMPLANT
SPONGE LAP 18X18 RF (DISPOSABLE) ×2 IMPLANT
STAPLER VISISTAT (STAPLE) ×2 IMPLANT
STRIP CLOSURE SKIN 1/2X4 (GAUZE/BANDAGES/DRESSINGS) ×1 IMPLANT
SUT CHROMIC 1 CT1 27 (SUTURE) ×2 IMPLANT
SUT PDS AB 0 CT1 36 (SUTURE) ×4 IMPLANT
SUT VIC AB 0 CT1 18XCR BRD8 (SUTURE) IMPLANT
SUT VIC AB 0 CT1 8-18 (SUTURE) ×3
SUT VIC AB 2-0 CT1 (SUTURE) ×4 IMPLANT
SUT VIC AB 3-0 PS2 18 (SUTURE) ×3
SUT VIC AB 3-0 PS2 18XBRD (SUTURE) ×1 IMPLANT
SUT VICRYL 0 ENDOLOOP (SUTURE) IMPLANT
SUT VICRYL 0 TIES 12 18 (SUTURE) ×2 IMPLANT
SUT VICRYL 0 UR6 27IN ABS (SUTURE) IMPLANT
SUT VICRYL 4-0 PS2 18IN ABS (SUTURE) IMPLANT
SYS BAG RETRIEVAL 10MM (BASKET)
SYSTEM BAG RETRIEVAL 10MM (BASKET) IMPLANT
TOWEL OR 17X24 6PK STRL BLUE (TOWEL DISPOSABLE) ×6 IMPLANT
TRAY FOLEY W/BAG SLVR 14FR (SET/KITS/TRAYS/PACK) ×2 IMPLANT
TROCAR BALLN 12MMX100 BLUNT (TROCAR) IMPLANT
TROCAR OPTI TIP 5M 100M (ENDOMECHANICALS) ×3 IMPLANT
TROCAR XCEL DIL TIP R 11M (ENDOMECHANICALS) IMPLANT
TUBING INSUF HEATED (TUBING) ×3 IMPLANT
WARMER LAPAROSCOPE (MISCELLANEOUS) ×3 IMPLANT
WATER STERILE IRR 500ML POUR (IV SOLUTION) ×3 IMPLANT
YANKAUER SUCT BULB TIP NO VENT (SUCTIONS) ×2 IMPLANT

## 2017-12-01 NOTE — H&P (Signed)
  History and physical exam unchanged 

## 2017-12-01 NOTE — Transfer of Care (Signed)
Immediate Anesthesia Transfer of Care Note  Patient: Arieona Swaggerty  Procedure(s) Performed: ATTEMPTED LAPAROSCOPIC OOPHORECTOMY CONVERTED TO OPEN OOPHORECTOMY (Right Abdomen)  Patient Location: PACU  Anesthesia Type:General  Level of Consciousness: awake, alert , oriented and patient cooperative  Airway & Oxygen Therapy: Patient Spontanous Breathing and Patient connected to nasal cannula oxygen  Post-op Assessment: Report given to RN and Post -op Vital signs reviewed and stable  Post vital signs: Reviewed and stable  Last Vitals:  Vitals Value Taken Time  BP 117/73 12/01/2017  9:07 AM  Temp    Pulse 63 12/01/2017  9:08 AM  Resp    SpO2 98 % 12/01/2017  9:08 AM  Vitals shown include unvalidated device data.  Last Pain:  Vitals:   12/01/17 0557  TempSrc: Oral  PainSc:       Patients Stated Pain Goal: 4 (67/01/41 0301)  Complications: No apparent anesthesia complications

## 2017-12-01 NOTE — Progress Notes (Signed)
Patient ID: Shelia Hamilton, female   DOB: 1970-08-02, 47 y.o.   MRN: [id number] AF VSS  GOOD UO DRESSING REINFORCED DOING WELL NOW CONTINUE POST OP CARE

## 2017-12-01 NOTE — Progress Notes (Signed)
Umbilical drsg with  Moderate shadow of sanguinous shadow .  Dressing reinforced w folded 4x4 and hypafix.  Dr. Radene Knee called and informed.  No new orders received. Ready to transfer to 1311.

## 2017-12-01 NOTE — Anesthesia Procedure Notes (Addendum)
Procedure Name: Intubation Date/Time: 12/01/2017 7:36 AM Performed by: Wanita Chamberlain, CRNA Pre-anesthesia Checklist: Patient identified, Timeout performed, Emergency Drugs available, Suction available and Patient being monitored Patient Re-evaluated:Patient Re-evaluated prior to induction Oxygen Delivery Method: Circle system utilized Preoxygenation: Pre-oxygenation with 100% oxygen Induction Type: IV induction Ventilation: Mask ventilation without difficulty Laryngoscope Size: Mac and 3 Grade View: Grade I Tube type: Oral Tube size: 7.0 mm Number of attempts: 1 Airway Equipment and Method: Stylet Placement Confirmation: CO2 detector,  positive ETCO2,  breath sounds checked- equal and bilateral and ETT inserted through vocal cords under direct vision Secured at: 23 cm Tube secured with: Tape Dental Injury: Teeth and Oropharynx as per pre-operative assessment

## 2017-12-01 NOTE — Op Note (Signed)
Shelia Hamilton, Shelia Hamilton MEDICAL RECORD [id number] ACCOUNT [id number] DATE OF BIRTH:08-Dec-1970 FACILITY[facility] LOCATION: [facility] PHYSICIAN:Zareth Rippetoe Sherran Needs, MD  OPERATIVE REPORT  DATE OF PROCEDURE:  12/01/2017  PREOPERATIVE DIAGNOSES:  Pelvic pain with cystic enlargement of the right ovary.  POSTOPERATIVE DIAGNOSES:  Pelvic adhesions with adhesions and cystic enlargement of the right ovary.  OPERATIVE PROCEDURE:  Diagnostic laparoscopy with subsequent exploratory laparotomy, lysis of adhesions and right salpingo-oophorectomy.  SURGEON:  Arvella Nigh, MD  ASSISTANT:  Matthew Saras   ESTIMATED BLOOD LOSS:  200 mL.  PACKS:  None.  DRAINS:  Include urethral Foley.  INTRAOPERATIVE BLOOD PLACED:  None.  COMPLICATIONS:  None.  INDICATIONS:  Dictated in History and Physical.  DESCRIPTION OF PROCEDURE:  The patient was taken to the OR and placed in supine position.  After satisfactory level of general anesthesia was obtained, she was placed in the dorsal lithotomy position using Allen stirrups.  Perineum and vagina were  prepped out with Betadine.  Bladder was then emptied by out catheterization.  A sponge on a sponge stick was placed in the vaginal vault.  The patient was then draped in sterile field.  Subumbilical incision was made with a knife.  It was extended  through the subcutaneous tissue to the fascia.  The fascia was entered sharply with blunt finger pressure.  We were able to get into the peritoneum.  An open laparoscopic trocar was put in place and secured.  The abdomen was inflated with carbon dioxide.   Laparoscope was introduced.  She had some adhesions of the small intestine to the left periumbilical area that did not appear to be injured by the entry.  The patient was placed in the dorsal lithotomy position.  A 5 mm scope was put in place in  suprapubic area.  She had extensive adhesions from the descending colon to the anterior abdominal wall.  These were taken down using sharp  dissection.  There were adhesions in the pelvis with fluid accumulation.  These were taken down and the fluid was  drained.  The ovary was densely adherent to the right pelvic sidewall.  The decision was to proceed with exploratory surgery.  The abdomen was deflated of carbon dioxide.  All trocars were removed.  The patient was repositioned.  A Foley was placed to straight drain.  Previous low transverse incision was identified and excised.  The excision was extended through subcutaneous  tissue.  The fascia was then sharply incised in the fascia laterally.  The fascia was taken off the muscle superiorly and inferiorly.  Rectus muscles were separated in the midline.  We already had a hole in the perineum from the subumbilical port.  It  was used to take down the peritoneum.  She had some adhesions from the small intestine to the anterior abdominal wall.  These were mobilized and taken down with no injury to the small intestine.  We then went to the right pelvic sidewall and the ovary.   It had some flimsy adhesions with accumulation of fluid.  We were able to eventually elevate the ovary.  We could easily identify the ureter.  An incision was made in the peritoneum over the psoas muscle.  We then dissected out to the ovarian  vasculature.  We could easily identify the ureter.  We then isolated the ovarian vasculature above it, clamped, cut and doubly ligated first with a free tie of 0 Vicryl, then a suture ligature of 0 Vicryl.  We were able to dissect the ovary up  from its  peritoneal attachment.  The main pedicle was clamped, cut, tied and secured with free tie of 0 Vicryl.  We could easily palpate the course of the ureter throughout the right pelvic sidewall.  It was not involved in the surgical procedure in any manner.   The decision was not to do cystoscopy.  We then went to the left pelvic sidewall.  We were able to free that ovary up.  It looked better than the one on the right.  The adhesions were  minimal.  We decided to leave it in place per the patient's request.   We irrigated the pelvis, removed the irrigation.  We had good hemostasis.  No evidence of injury to bowel at this point and clear urine output.  At this point in time, packs and self-retaining retractor were removed.  The peritoneum was closed with  running suture of 0 Vicryl.  The fascia was closed with running suture of 0 PDS.  Skin was closed with interrupted subcuticulars of 0 chromic and staples with Steri-Strips.  Subumbilical incision and fascia was closed with figure-of-eight of 0 Vicryl.   Skin was closed with interrupted subcuticulars of 4-0 Vicryl.  Suprapubic incisions were closed with Dermabond.  We used the staples due to a discrepancy in the height of the skin just to bring it together more easily.  At this point in time, we had good  clear urine output.  The patient was taken out of dorsal lithotomy position, was alert and extubated and transferred to recovery room in good condition.  Sponge, instrument and needle count was correct by circulating nurse times multiple times.  LN/NUANCE  D:12/01/2017 T:12/01/2017 JOB:[id number]

## 2017-12-01 NOTE — Progress Notes (Signed)
Gave report to Delilah Shan, RN to assume care of patient.

## 2017-12-01 NOTE — Anesthesia Postprocedure Evaluation (Signed)
Anesthesia Post Note  Patient: Shelia Hamilton  Procedure(s) Performed: ATTEMPTED LAPAROSCOPIC OOPHORECTOMY CONVERTED TO OPEN OOPHORECTOMY (Right Abdomen)     Patient location during evaluation: PACU Anesthesia Type: General Level of consciousness: awake and alert Pain management: pain level controlled Vital Signs Assessment: post-procedure vital signs reviewed and stable Respiratory status: spontaneous breathing, nonlabored ventilation and respiratory function stable Cardiovascular status: blood pressure returned to baseline and stable Postop Assessment: no apparent nausea or vomiting Anesthetic complications: no    Last Vitals:  Vitals:   12/01/17 0945 12/01/17 1000  BP: 109/65 108/67  Pulse: (!) 55 (!) 50  Resp: 16 12  Temp:    SpO2: 100% 99%    Last Pain:  Vitals:   12/01/17 1000  TempSrc:   PainSc: [place]

## 2017-12-01 NOTE — Brief Op Note (Signed)
12/01/2017  9:06 AM  PATIENT:  Shelia Hamilton  47 y.o. female  PRE-OPERATIVE DIAGNOSIS:  dysparuenia, right ovarian cyst  POST-OPERATIVE DIAGNOSIS:  dysparuenia, right ovarian cyst  PROCEDURE:  Procedure(s): ATTEMPTED LAPAROSCOPIC OOPHORECTOMY CONVERTED TO OPEN OOPHORECTOMY (Right)  SURGEON:  Surgeon(s) and Role:    * Arvella Nigh, MD - Primary    * Molli Posey, MD - Assisting  PHYSICIAN ASSISTANT:   ASSISTANTS: holland   ANESTHESIA:   local and general  EBL:  50 mL   BLOOD ADMINISTERED:none  DRAINS: Urinary Catheter (Foley)   LOCAL MEDICATIONS USED:  MARCAINE     SPECIMEN:right ovary  DISPOSITION OF SPECIMEN: to pathology  COUNTS:  YES  TOURNIQUET:  * No tourniquets in log *  DICTATION: .Other Dictation: Dictation Number [id number]  PLAN OF CARE: Admit for overnight observation  PATIENT DISPOSITION:  PACU - hemodynamically stable.   Delay start of Pharmacological VTE agent (>24hrs) due to surgical blood loss or risk of bleeding: yes

## 2017-12-02 DIAGNOSIS — N8301 Follicular cyst of right ovary: Secondary | ICD-10-CM | POA: Diagnosis present

## 2017-12-02 DIAGNOSIS — N736 Female pelvic peritoneal adhesions (postinfective): Secondary | ICD-10-CM | POA: Diagnosis present

## 2017-12-02 DIAGNOSIS — N9412 Deep dyspareunia: Secondary | ICD-10-CM | POA: Diagnosis present

## 2017-12-02 DIAGNOSIS — Z90710 Acquired absence of both cervix and uterus: Secondary | ICD-10-CM | POA: Diagnosis not present

## 2017-12-02 LAB — CBC
HCT: 33.1 % — ABNORMAL LOW (ref 36.0–46.0)
HEMOGLOBIN: 10.8 g/dL — AB (ref 12.0–15.0)
MCH: 28.1 pg (ref 26.0–34.0)
MCHC: 32.6 g/dL (ref 30.0–36.0)
MCV: 86.2 fL (ref 80.0–100.0)
Platelets: 221 10*3/uL (ref 150–400)
RBC: 3.84 MIL/uL — AB (ref 3.87–5.11)
RDW: 13.7 % (ref 11.5–15.5)
WBC: 12.7 10*3/uL — AB (ref 4.0–10.5)
nRBC: 0 % (ref 0.0–0.2)

## 2017-12-02 NOTE — Progress Notes (Signed)
12/02/2017 4:44 PM Report called to Marcie Bal on [address]. Questions and concerns addressed. Pt. Transferred to room 1528 per orders.  Keondra Haydu, Arville Lime

## 2017-12-02 NOTE — Progress Notes (Signed)
1 Day Post-Op Procedure(s) (LRB): ATTEMPTED LAPAROSCOPIC OOPHORECTOMY CONVERTED TO OPEN OOPHORECTOMY (Right)  Subjective: Patient reports tolerating PO.    Objective: I have reviewed patient's vital signs, intake and output and labs.  General: alert GI: soft, non-tender; bowel sounds normal; no masses,  no organomegaly  Assessment: s/p Procedure(s): ATTEMPTED LAPAROSCOPIC OOPHORECTOMY CONVERTED TO OPEN OOPHORECTOMY (Right): stable  Plan: Advance diet  LOS: 0 days    Adeola Dennen 12/02/2017, 8:41 AM

## 2017-12-03 ENCOUNTER — Other Ambulatory Visit: Payer: Self-pay

## 2017-12-03 NOTE — Discharge Summary (Signed)
NAMEJOLETTA, MANNER MEDICAL RECORD [id number] ACCOUNT [id number] DATE OF BIRTH:1970/09/07 [facility]ATION: [facility] PHYSICIAN:Kierria Feigenbaum Sherran Needs, MD  DISCHARGE SUMMARY  DATE OF DISCHARGE:  12/03/2017  ADMISSION DIAGNOSES:  Pelvic pain cystic with enlargement of the right ovary.  DISCHARGE DIAGNOSES:  Extensive pelvic adhesions.  OPERATIVE PROCEDURE:  Diagnostic laparoscopy with exploratory laparotomy, lysis of adhesions and right salpingo-oophorectomy.    For complete history and physical, please see dictated note.  HOSPITAL COURSE:  The patient underwent the above noted surgery.  She had extensive pelvic adhesions requiring exploratory surgery to remove the right ovary.  Her postop hemoglobin was 10.8.  She was discharged home on her second postoperative day.  At  that time she was afebrile with stable vital signs.  Abdomen was soft.  Incisions were clear.  She had normal flatus and was voiding without difficulty and was tolerating her diet.  In the terms of complications, none encountered during the hospital stay.   DISPOSITION:  The patient discharged home in stable condition.  DISCHARGE INSTRUCTIONS:  The patient to avoid heavy lifting or driving a car.  She is to call should there be fever, nausea, vomiting, excessive pain.  Also, extracted in signs and symptoms of deep venous thrombosis and pulmonary embolus.  Discharged home on Percocet as needed for pain with plan to follow up on Monday and remove the staples.  AN/NUANCE D:12/03/2017 T:12/03/2017 JOB:[id number]

## 2017-12-03 NOTE — Discharge Summary (Signed)
Patient name Shelia Hamilton, [place]olak DICTATION# [id number] CSN# [id number]  Arvella Nigh, MD 12/03/2017 8:40 AM

## 2017-12-03 NOTE — Progress Notes (Signed)
2 Days Post-Op Procedure(s) (LRB): ATTEMPTED LAPAROSCOPIC OOPHORECTOMY CONVERTED TO OPEN OOPHORECTOMY (Right)  Subjective: Patient reports tolerating PO, + flatus and no problems voiding.    Objective: I have reviewed patient's vital signs.  General: alert GI: soft, non-tender; bowel sounds normal; no masses,  no organomegaly  Assessment: s/p Procedure(s): ATTEMPTED LAPAROSCOPIC OOPHORECTOMY CONVERTED TO OPEN OOPHORECTOMY (Right): stable  Plan: Discharge home  LOS: 1 day    Paia 12/03/2017, 8:36 AM

## 2017-12-03 NOTE — Progress Notes (Signed)
Discharge instructions given to pt and all questions were answered. Pt taken down to the lobby via wheel chair and was picked up by her friend.

## 2018-01-05 IMAGING — CR DG CHEST 2V
2 series · 2 of 2 positions shown · non-contrast
Comparison: Radiographs June 07, 2012.

CLINICAL DATA: Preop.

EXAM:
CHEST  2 VIEW

[w chest pa]
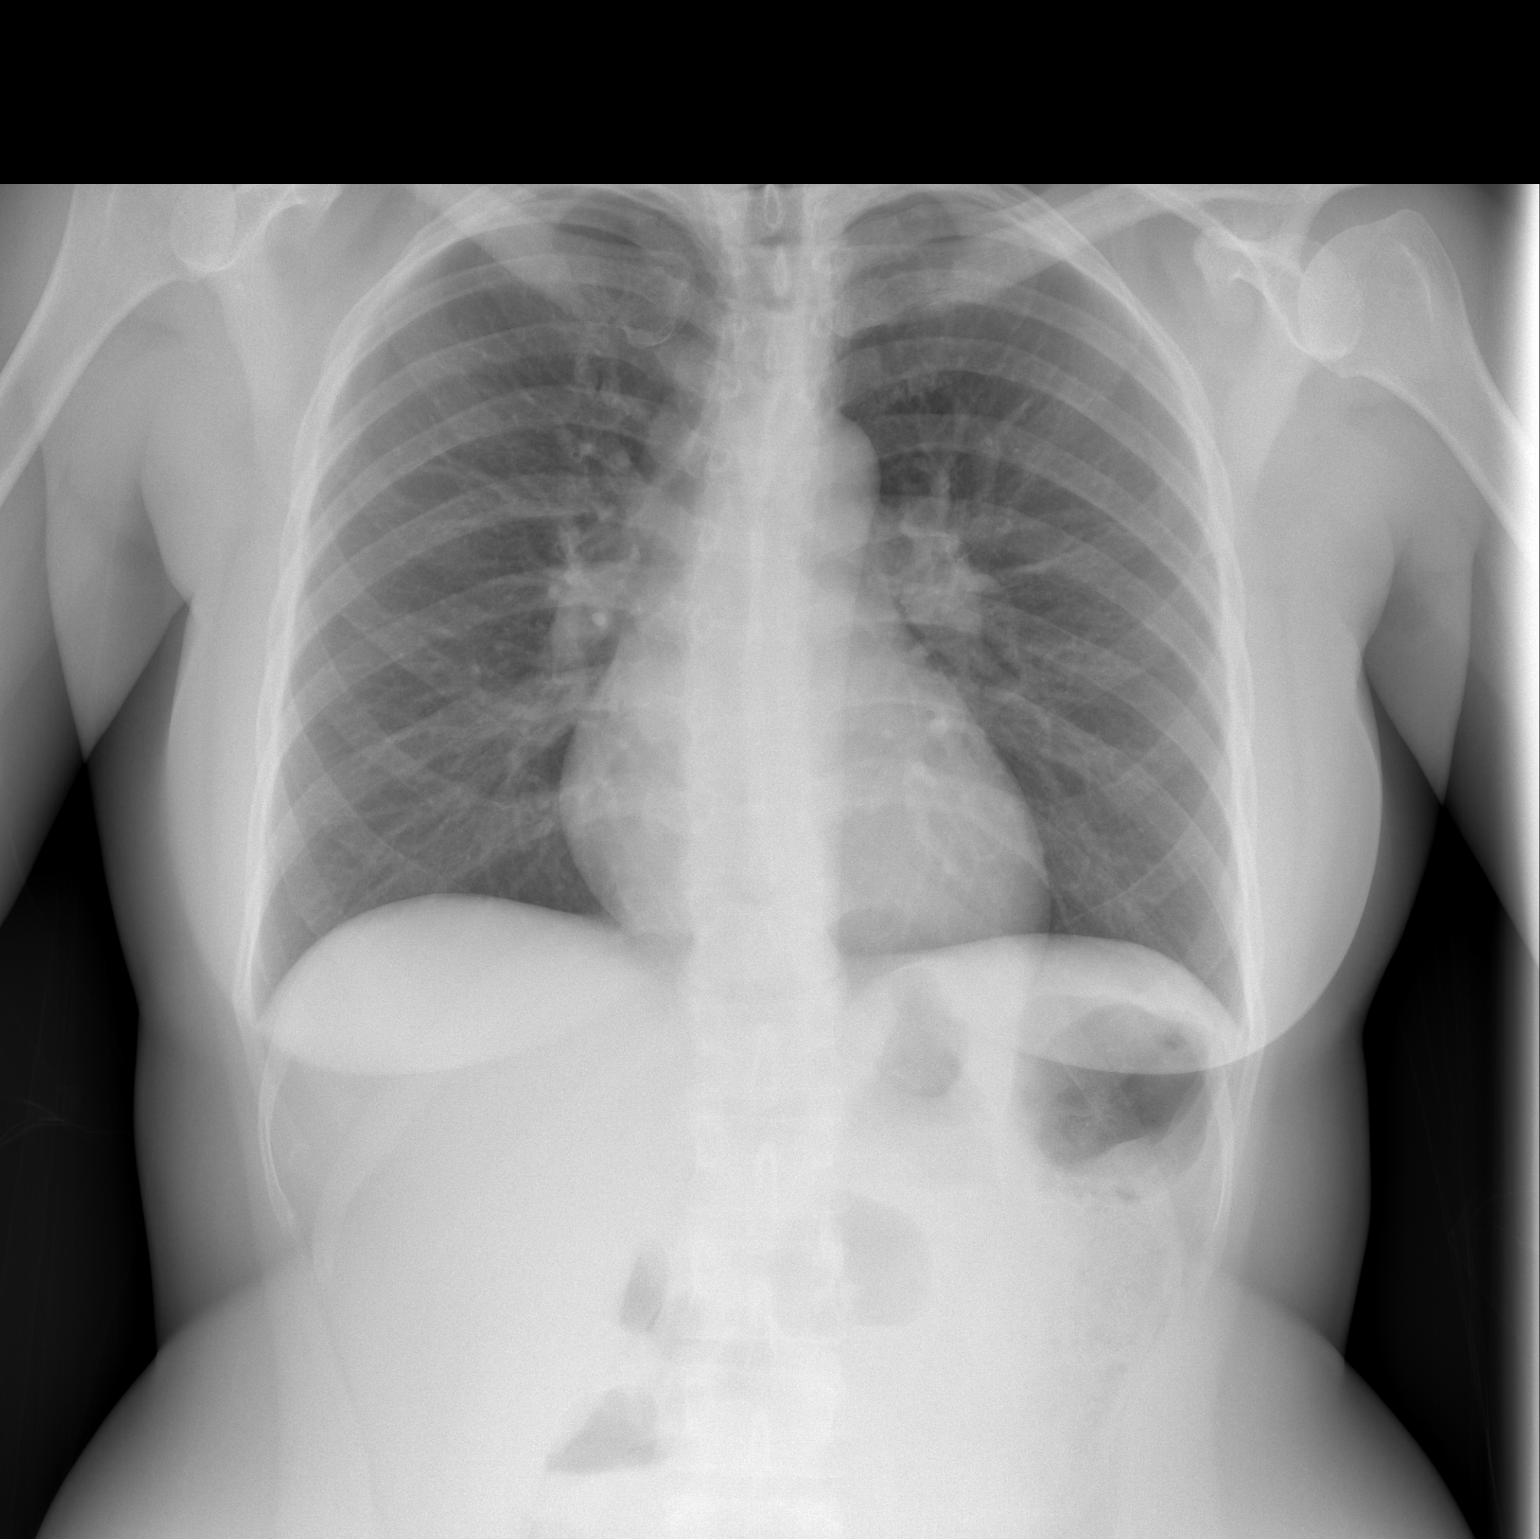

[w chest lat]
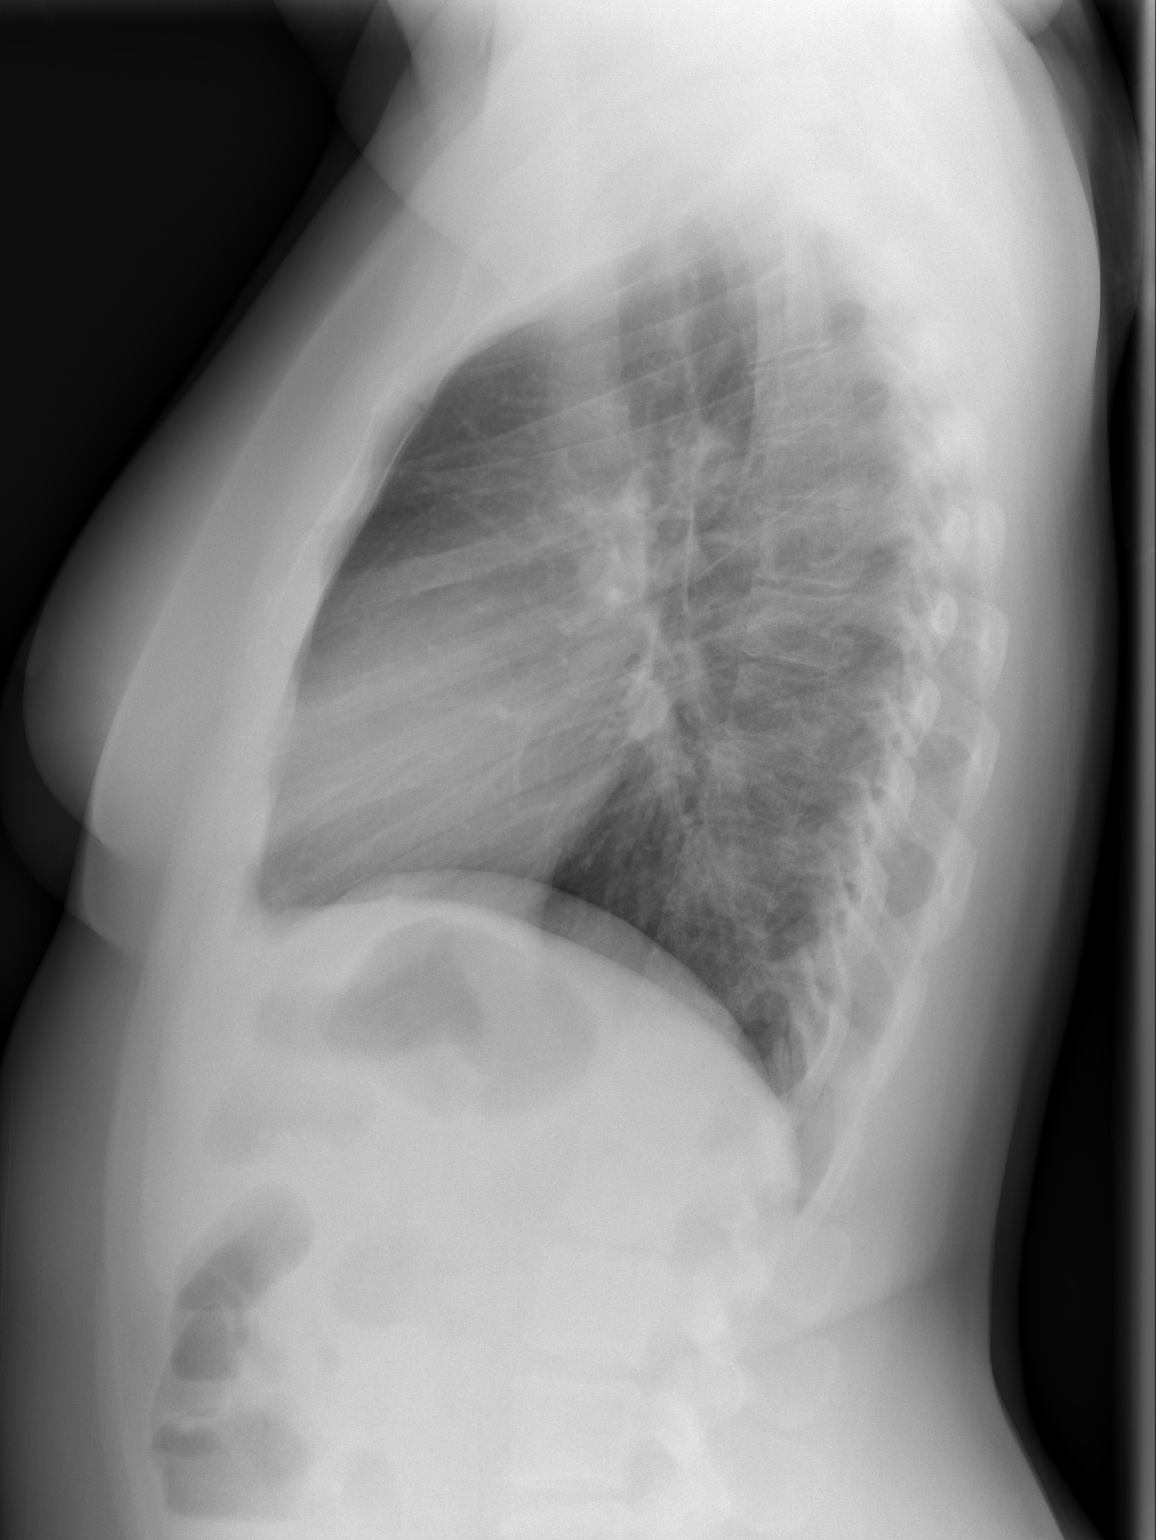

[2 of 2 positions shown; findings below may reference images not displayed]

FINDINGS: The heart size and mediastinal contours are within normal limits.
Both lungs are clear. The visualized skeletal structures are
unremarkable.
IMPRESSION: No active cardiopulmonary disease.

## 2018-03-25 ENCOUNTER — Other Ambulatory Visit: Payer: Self-pay | Admitting: Endocrinology

## 2018-03-25 DIAGNOSIS — E041 Nontoxic single thyroid nodule: Secondary | ICD-10-CM

## 2018-03-26 ENCOUNTER — Ambulatory Visit
Admission: RE | Admit: 2018-03-26 | Discharge: 2018-03-26 | Disposition: A | Discharge status: Home / Self Care | Payer: BC Managed Care – PPO | Source: Ambulatory Visit | Attending: Endocrinology | Admitting: Endocrinology

## 2018-03-26 DIAGNOSIS — E041 Nontoxic single thyroid nodule: Secondary | ICD-10-CM

## 2018-12-02 ENCOUNTER — Other Ambulatory Visit: Payer: Self-pay | Admitting: Obstetrics and Gynecology

## 2018-12-02 DIAGNOSIS — Z803 Family history of malignant neoplasm of breast: Secondary | ICD-10-CM

## 2018-12-09 ENCOUNTER — Other Ambulatory Visit: Payer: Self-pay | Admitting: Obstetrics and Gynecology

## 2018-12-09 DIAGNOSIS — Z803 Family history of malignant neoplasm of breast: Secondary | ICD-10-CM

## 2019-03-04 ENCOUNTER — Other Ambulatory Visit: Payer: Self-pay | Admitting: Obstetrics and Gynecology

## 2019-03-04 DIAGNOSIS — Z803 Family history of malignant neoplasm of breast: Secondary | ICD-10-CM

## 2020-04-05 ENCOUNTER — Other Ambulatory Visit: Payer: Self-pay | Admitting: Obstetrics and Gynecology

## 2020-04-05 DIAGNOSIS — Z803 Family history of malignant neoplasm of breast: Secondary | ICD-10-CM

## 2020-04-18 ENCOUNTER — Ambulatory Visit: Payer: BC Managed Care – PPO

## 2020-04-26 ENCOUNTER — Ambulatory Visit
Admission: RE | Admit: 2020-04-26 | Discharge: 2020-04-26 | Disposition: A | Discharge status: Home / Self Care | Payer: No Typology Code available for payment source | Source: Ambulatory Visit | Attending: Obstetrics and Gynecology | Admitting: Obstetrics and Gynecology

## 2020-04-26 DIAGNOSIS — Z803 Family history of malignant neoplasm of breast: Secondary | ICD-10-CM

## 2020-04-26 MED ORDER — GADOBUTROL 1 MMOL/ML IV SOLN
7.0000 mL | Freq: Once | INTRAVENOUS | Status: AC | PRN
  Administered 2020-04-26: 7 mL via INTRAVENOUS

## 2021-06-29 ENCOUNTER — Other Ambulatory Visit: Payer: Self-pay | Admitting: Endocrinology

## 2021-06-29 DIAGNOSIS — E041 Nontoxic single thyroid nodule: Secondary | ICD-10-CM

## 2021-07-02 ENCOUNTER — Ambulatory Visit
Admission: RE | Admit: 2021-07-02 | Discharge: 2021-07-02 | Disposition: A | Discharge status: Home / Self Care | Payer: BC Managed Care – PPO | Source: Ambulatory Visit | Attending: Endocrinology | Admitting: Endocrinology

## 2021-07-02 DIAGNOSIS — E041 Nontoxic single thyroid nodule: Secondary | ICD-10-CM

## 2022-03-20 DIAGNOSIS — F4312 Post-traumatic stress disorder, chronic: Secondary | ICD-10-CM | POA: Diagnosis not present

## 2022-03-28 DIAGNOSIS — E890 Postprocedural hypothyroidism: Secondary | ICD-10-CM | POA: Diagnosis not present

## 2022-03-28 DIAGNOSIS — R7301 Impaired fasting glucose: Secondary | ICD-10-CM | POA: Diagnosis not present

## 2022-04-01 DIAGNOSIS — F4312 Post-traumatic stress disorder, chronic: Secondary | ICD-10-CM | POA: Diagnosis not present

## 2022-04-09 DIAGNOSIS — E669 Obesity, unspecified: Secondary | ICD-10-CM | POA: Diagnosis not present

## 2022-04-09 DIAGNOSIS — E890 Postprocedural hypothyroidism: Secondary | ICD-10-CM | POA: Diagnosis not present

## 2022-04-09 DIAGNOSIS — E041 Nontoxic single thyroid nodule: Secondary | ICD-10-CM | POA: Diagnosis not present

## 2022-04-09 DIAGNOSIS — R7301 Impaired fasting glucose: Secondary | ICD-10-CM | POA: Diagnosis not present

## 2022-04-15 DIAGNOSIS — F4312 Post-traumatic stress disorder, chronic: Secondary | ICD-10-CM | POA: Diagnosis not present

## 2022-04-29 DIAGNOSIS — F4312 Post-traumatic stress disorder, chronic: Secondary | ICD-10-CM | POA: Diagnosis not present

## 2022-05-13 DIAGNOSIS — F4312 Post-traumatic stress disorder, chronic: Secondary | ICD-10-CM | POA: Diagnosis not present

## 2022-05-27 DIAGNOSIS — F4312 Post-traumatic stress disorder, chronic: Secondary | ICD-10-CM | POA: Diagnosis not present

## 2022-06-05 DIAGNOSIS — E890 Postprocedural hypothyroidism: Secondary | ICD-10-CM | POA: Diagnosis not present

## 2022-06-05 DIAGNOSIS — R7301 Impaired fasting glucose: Secondary | ICD-10-CM | POA: Diagnosis not present

## 2022-06-11 DIAGNOSIS — F4312 Post-traumatic stress disorder, chronic: Secondary | ICD-10-CM | POA: Diagnosis not present

## 2022-06-12 DIAGNOSIS — E890 Postprocedural hypothyroidism: Secondary | ICD-10-CM | POA: Diagnosis not present

## 2022-06-12 DIAGNOSIS — E669 Obesity, unspecified: Secondary | ICD-10-CM | POA: Diagnosis not present

## 2022-06-12 DIAGNOSIS — R7301 Impaired fasting glucose: Secondary | ICD-10-CM | POA: Diagnosis not present

## 2022-06-12 DIAGNOSIS — E041 Nontoxic single thyroid nodule: Secondary | ICD-10-CM | POA: Diagnosis not present

## 2022-07-24 DIAGNOSIS — E890 Postprocedural hypothyroidism: Secondary | ICD-10-CM | POA: Diagnosis not present
